# Patient Record
Sex: Male | Born: 1965
Health system: Southern US, Community
[De-identification: ages and names within clinical notes are randomized; demographics above are authoritative.]

## PROBLEM LIST (undated history)

## (undated) DIAGNOSIS — I10 Essential (primary) hypertension: Secondary | ICD-10-CM

## (undated) DIAGNOSIS — J189 Pneumonia, unspecified organism: Secondary | ICD-10-CM

## (undated) HISTORY — PX: CIRCUMCISION: SUR203

---

## 2011-11-04 ENCOUNTER — Other Ambulatory Visit: Payer: Self-pay | Admitting: Neurology

## 2011-11-04 DIAGNOSIS — R531 Weakness: Secondary | ICD-10-CM

## 2011-11-07 ENCOUNTER — Ambulatory Visit (HOSPITAL_COMMUNITY)
Admission: RE | Admit: 2011-11-07 | Discharge: 2011-11-07 | Disposition: A | Discharge status: Home / Self Care | Payer: BC Managed Care – PPO | Source: Ambulatory Visit | Attending: Neurology | Admitting: Neurology

## 2011-11-07 DIAGNOSIS — M5020 Other cervical disc displacement, unspecified cervical region: Secondary | ICD-10-CM | POA: Insufficient documentation

## 2011-11-07 DIAGNOSIS — R531 Weakness: Secondary | ICD-10-CM

## 2011-11-07 DIAGNOSIS — M6281 Muscle weakness (generalized): Secondary | ICD-10-CM | POA: Insufficient documentation

## 2011-11-07 DIAGNOSIS — M542 Cervicalgia: Secondary | ICD-10-CM | POA: Insufficient documentation

## 2011-11-11 ENCOUNTER — Encounter (HOSPITAL_COMMUNITY): Payer: Self-pay | Admitting: Respiratory Therapy

## 2011-11-11 ENCOUNTER — Other Ambulatory Visit: Payer: Self-pay | Admitting: Neurosurgery

## 2011-11-14 ENCOUNTER — Encounter (HOSPITAL_COMMUNITY)
Admission: RE | Admit: 2011-11-14 | Discharge: 2011-11-14 | Disposition: A | Discharge status: Home / Self Care | Payer: BC Managed Care – PPO | Source: Ambulatory Visit | Attending: Neurosurgery | Admitting: Neurosurgery

## 2011-11-14 ENCOUNTER — Encounter (HOSPITAL_COMMUNITY): Payer: Self-pay

## 2011-11-14 HISTORY — DX: Essential (primary) hypertension: I10

## 2011-11-14 HISTORY — DX: Pneumonia, unspecified organism: J18.9

## 2011-11-14 LAB — CBC
Hemoglobin: 15.1 g/dL (ref 13.0–17.0)
MCH: 29.6 pg (ref 26.0–34.0)
MCHC: 33.4 g/dL (ref 30.0–36.0)
MCV: 88.6 fL (ref 78.0–100.0)
RBC: 5.1 MIL/uL (ref 4.22–5.81)

## 2011-11-14 LAB — BASIC METABOLIC PANEL
BUN: 15 mg/dL (ref 6–23)
CO2: 28 mEq/L (ref 19–32)
Calcium: 10 mg/dL (ref 8.4–10.5)
GFR calc non Af Amer: >90 mL/min (ref >90)
Glucose, Bld: 97 mg/dL (ref 70–99)
Potassium: 3.9 mEq/L (ref 3.5–5.1)

## 2011-11-14 NOTE — Pre-Procedure Instructions (Signed)
20 David Morse  11/14/2011   Your procedure is scheduled on:  Nov 17, 2011 (Monday)  Report to [facility] at 1245 AM.  Call this number if you have problems the morning of surgery: [phone]   Remember:   Do not eat food:After Midnight.  May have clear liquids: up to 4 Hours before arrival.  Clear liquids include soda, tea, black coffee, apple or grape juice, broth.  Take these medicines the morning of surgery with A SIP OF WATER: norvasc, doxycycline   Do not wear jewelry, make-up or nail polish.  Do not wear lotions, powders, or perfumes. You may wear deodorant.  Do not shave 48 hours prior to surgery.  Do not bring valuables to the hospital.  Contacts, dentures or bridgework may not be worn into surgery.  Leave suitcase in the car. After surgery it may be brought to your room.  For patients admitted to the hospital, checkout time is 11:00 AM the day of discharge.   Patients discharged the day of surgery will not be allowed to drive home.  Name and phone number of your driver: na  Special Instructions: CHG Shower Use Special Wash: 1/2 bottle night before surgery and 1/2 bottle morning of surgery.   Please read over the following fact sheets that you were given: Pain Booklet and Surgical Site Infection Prevention

## 2011-11-16 MED ORDER — CEFAZOLIN SODIUM-DEXTROSE 2-3 GM-% IV SOLR
2.0000 g | INTRAVENOUS | Status: AC
  Administered 2011-11-17: 2 g via INTRAVENOUS
  Filled 2011-11-16: qty 50

## 2011-11-17 ENCOUNTER — Ambulatory Visit (HOSPITAL_COMMUNITY): Payer: BC Managed Care – PPO | Admitting: Critical Care Medicine

## 2011-11-17 ENCOUNTER — Encounter (HOSPITAL_COMMUNITY): Payer: Self-pay | Admitting: *Deleted

## 2011-11-17 ENCOUNTER — Inpatient Hospital Stay (HOSPITAL_COMMUNITY)
Admission: RE | Admit: 2011-11-17 | Discharge: 2011-11-21 | DRG: 558 | Disposition: A | Discharge status: Home / Self Care | Payer: BC Managed Care – PPO | Source: Ambulatory Visit | Attending: Neurosurgery | Admitting: Neurosurgery

## 2011-11-17 ENCOUNTER — Encounter (HOSPITAL_COMMUNITY): Payer: Self-pay | Admitting: Critical Care Medicine

## 2011-11-17 ENCOUNTER — Encounter (HOSPITAL_COMMUNITY)
Admission: RE | Disposition: A | Discharge status: Home / Self Care | Payer: Self-pay | Source: Ambulatory Visit | Attending: Neurosurgery

## 2011-11-17 ENCOUNTER — Ambulatory Visit (HOSPITAL_COMMUNITY): Payer: BC Managed Care – PPO

## 2011-11-17 DIAGNOSIS — I10 Essential (primary) hypertension: Secondary | ICD-10-CM | POA: Diagnosis present

## 2011-11-17 DIAGNOSIS — Z01812 Encounter for preprocedural laboratory examination: Secondary | ICD-10-CM

## 2011-11-17 DIAGNOSIS — Y9210 Unspecified residential institution as the place of occurrence of the external cause: Secondary | ICD-10-CM | POA: Diagnosis not present

## 2011-11-17 DIAGNOSIS — R339 Retention of urine, unspecified: Secondary | ICD-10-CM | POA: Diagnosis not present

## 2011-11-17 DIAGNOSIS — Y838 Other surgical procedures as the cause of abnormal reaction of the patient, or of later complication, without mention of misadventure at the time of the procedure: Secondary | ICD-10-CM | POA: Diagnosis not present

## 2011-11-17 DIAGNOSIS — F17200 Nicotine dependence, unspecified, uncomplicated: Secondary | ICD-10-CM | POA: Diagnosis present

## 2011-11-17 DIAGNOSIS — IMO0002 Reserved for concepts with insufficient information to code with codable children: Secondary | ICD-10-CM | POA: Diagnosis not present

## 2011-11-17 DIAGNOSIS — Z888 Allergy status to other drugs, medicaments and biological substances status: Secondary | ICD-10-CM

## 2011-11-17 DIAGNOSIS — M4712 Other spondylosis with myelopathy, cervical region: Principal | ICD-10-CM | POA: Diagnosis present

## 2011-11-17 HISTORY — PX: ANTERIOR CERVICAL DECOMP/DISCECTOMY FUSION: SHX1161

## 2011-11-17 SURGERY — ANTERIOR CERVICAL DECOMPRESSION/DISCECTOMY FUSION 1 LEVEL
Anesthesia: General | Site: Neck | Wound class: Clean

## 2011-11-17 MED ORDER — HYDROCODONE-ACETAMINOPHEN 5-325 MG PO TABS: 1.0000 | ORAL_TABLET | ORAL | Status: DC | PRN

## 2011-11-17 MED ORDER — ONDANSETRON HCL 4 MG/2ML IJ SOLN: 4.0000 mg | Freq: Four times a day (QID) | INTRAMUSCULAR | Status: DC | PRN

## 2011-11-17 MED ORDER — FENTANYL CITRATE 0.05 MG/ML IJ SOLN
INTRAMUSCULAR | Status: DC | PRN
  Administered 2011-11-17 (×2): 50 ug via INTRAVENOUS
  Administered 2011-11-17: 100 ug via INTRAVENOUS
  Administered 2011-11-17 (×3): 50 ug via INTRAVENOUS
  Administered 2011-11-17: 100 ug via INTRAVENOUS

## 2011-11-17 MED ORDER — PHENOL 1.4 % MT LIQD
1.0000 | OROMUCOSAL | Status: DC | PRN
  Filled 2011-11-17 (×2): qty 177

## 2011-11-17 MED ORDER — NEOSTIGMINE METHYLSULFATE 1 MG/ML IJ SOLN
INTRAMUSCULAR | Status: DC | PRN
  Administered 2011-11-17: 5 mg via INTRAVENOUS

## 2011-11-17 MED ORDER — PANTOPRAZOLE SODIUM 40 MG IV SOLR
40.0000 mg | Freq: Every day | INTRAVENOUS | Status: DC
  Administered 2011-11-17: 40 mg via INTRAVENOUS
  Filled 2011-11-17 (×3): qty 40

## 2011-11-17 MED ORDER — DIAZEPAM 5 MG PO TABS
5.0000 mg | ORAL_TABLET | Freq: Four times a day (QID) | ORAL | Status: DC | PRN
  Administered 2011-11-18 – 2011-11-20 (×7): 5 mg via ORAL
  Filled 2011-11-17 (×7): qty 1

## 2011-11-17 MED ORDER — OXYCODONE-ACETAMINOPHEN 5-325 MG PO TABS
1.0000 | ORAL_TABLET | ORAL | Status: DC | PRN
  Administered 2011-11-17 – 2011-11-21 (×10): 2 via ORAL
  Filled 2011-11-17 (×10): qty 2

## 2011-11-17 MED ORDER — MIDAZOLAM HCL 5 MG/5ML IJ SOLN
INTRAMUSCULAR | Status: DC | PRN
  Administered 2011-11-17: 2 mg via INTRAVENOUS

## 2011-11-17 MED ORDER — DOCUSATE SODIUM 100 MG PO CAPS
100.0000 mg | ORAL_CAPSULE | Freq: Two times a day (BID) | ORAL | Status: DC
  Administered 2011-11-17 – 2011-11-21 (×7): 100 mg via ORAL
  Filled 2011-11-17 (×9): qty 1

## 2011-11-17 MED ORDER — PHENYLEPHRINE HCL 10 MG/ML IJ SOLN
INTRAMUSCULAR | Status: DC | PRN
  Administered 2011-11-17: 80 ug via INTRAVENOUS
  Administered 2011-11-17: 40 ug via INTRAVENOUS

## 2011-11-17 MED ORDER — BACITRACIN 50000 UNITS IM SOLR
INTRAMUSCULAR | Status: AC
  Filled 2011-11-17: qty 1

## 2011-11-17 MED ORDER — 0.9 % SODIUM CHLORIDE (POUR BTL) OPTIME
TOPICAL | Status: DC | PRN
  Administered 2011-11-17: 1000 mL

## 2011-11-17 MED ORDER — HEMOSTATIC AGENTS (NO CHARGE) OPTIME
TOPICAL | Status: DC | PRN
  Administered 2011-11-17: 1 via TOPICAL

## 2011-11-17 MED ORDER — ONDANSETRON HCL 4 MG/2ML IJ SOLN
INTRAMUSCULAR | Status: DC | PRN
  Administered 2011-11-17: 4 mg via INTRAVENOUS

## 2011-11-17 MED ORDER — AMLODIPINE BESYLATE 5 MG PO TABS
5.0000 mg | ORAL_TABLET | Freq: Every day | ORAL | Status: DC
  Administered 2011-11-18 – 2011-11-21 (×3): 5 mg via ORAL
  Filled 2011-11-17 (×4): qty 1

## 2011-11-17 MED ORDER — DEXAMETHASONE 4 MG PO TABS
4.0000 mg | ORAL_TABLET | Freq: Four times a day (QID) | ORAL | Status: AC
  Administered 2011-11-18 (×2): 4 mg via ORAL
  Filled 2011-11-17 (×2): qty 1

## 2011-11-17 MED ORDER — MORPHINE SULFATE 4 MG/ML IJ SOLN
1.0000 mg | INTRAMUSCULAR | Status: DC | PRN
  Administered 2011-11-17: 2 mg via INTRAVENOUS
  Administered 2011-11-18 – 2011-11-19 (×4): 4 mg via INTRAVENOUS
  Filled 2011-11-17 (×5): qty 1

## 2011-11-17 MED ORDER — ZOLPIDEM TARTRATE 5 MG PO TABS
10.0000 mg | ORAL_TABLET | Freq: Every evening | ORAL | Status: DC | PRN
  Administered 2011-11-18 – 2011-11-20 (×3): 10 mg via ORAL
  Filled 2011-11-17 (×4): qty 1

## 2011-11-17 MED ORDER — LISINOPRIL-HYDROCHLOROTHIAZIDE 20-12.5 MG PO TABS: 1.0000 | ORAL_TABLET | Freq: Every day | ORAL | Status: DC

## 2011-11-17 MED ORDER — ROCURONIUM BROMIDE 100 MG/10ML IV SOLN
INTRAVENOUS | Status: DC | PRN
  Administered 2011-11-17: 50 mg via INTRAVENOUS

## 2011-11-17 MED ORDER — LACTATED RINGERS IV SOLN
INTRAVENOUS | Status: DC | PRN
  Administered 2011-11-17 (×2): via INTRAVENOUS

## 2011-11-17 MED ORDER — GLYCOPYRROLATE 0.2 MG/ML IJ SOLN
INTRAMUSCULAR | Status: DC | PRN
  Administered 2011-11-17: .8 mg via INTRAVENOUS

## 2011-11-17 MED ORDER — THROMBIN 5000 UNITS EX KIT
PACK | CUTANEOUS | Status: DC | PRN
  Administered 2011-11-17 (×2): 5000 [IU] via TOPICAL

## 2011-11-17 MED ORDER — LACTATED RINGERS IV SOLN
INTRAVENOUS | Status: DC
  Administered 2011-11-17: 23:00:00 via INTRAVENOUS

## 2011-11-17 MED ORDER — ACETAMINOPHEN 325 MG PO TABS
650.0000 mg | ORAL_TABLET | ORAL | Status: DC | PRN
  Filled 2011-11-17: qty 2

## 2011-11-17 MED ORDER — PROPOFOL 10 MG/ML IV EMUL
INTRAVENOUS | Status: DC | PRN
  Administered 2011-11-17: 200 mg via INTRAVENOUS

## 2011-11-17 MED ORDER — BACITRACIN ZINC 500 UNIT/GM EX OINT
TOPICAL_OINTMENT | CUTANEOUS | Status: DC | PRN
  Administered 2011-11-17: 1 via TOPICAL

## 2011-11-17 MED ORDER — HYDROCHLOROTHIAZIDE 12.5 MG PO CAPS
12.5000 mg | ORAL_CAPSULE | Freq: Every day | ORAL | Status: DC
  Administered 2011-11-17 – 2011-11-21 (×4): 12.5 mg via ORAL
  Filled 2011-11-17 (×5): qty 1

## 2011-11-17 MED ORDER — VECURONIUM BROMIDE 10 MG IV SOLR
INTRAVENOUS | Status: DC | PRN
  Administered 2011-11-17: 2 mg via INTRAVENOUS
  Administered 2011-11-17: 1 mg via INTRAVENOUS

## 2011-11-17 MED ORDER — DEXAMETHASONE SODIUM PHOSPHATE 4 MG/ML IJ SOLN
INTRAMUSCULAR | Status: DC | PRN
  Administered 2011-11-17: 8 mg via INTRAVENOUS

## 2011-11-17 MED ORDER — CEFAZOLIN SODIUM-DEXTROSE 2-3 GM-% IV SOLR
2.0000 g | Freq: Three times a day (TID) | INTRAVENOUS | Status: AC
  Administered 2011-11-18 (×2): 2 g via INTRAVENOUS
  Filled 2011-11-17 (×2): qty 50

## 2011-11-17 MED ORDER — LIDOCAINE HCL 4 % MT SOLN
OROMUCOSAL | Status: DC | PRN
  Administered 2011-11-17: 4 mL via TOPICAL

## 2011-11-17 MED ORDER — ONDANSETRON HCL 4 MG/2ML IJ SOLN: 4.0000 mg | INTRAMUSCULAR | Status: DC | PRN

## 2011-11-17 MED ORDER — DEXAMETHASONE SODIUM PHOSPHATE 4 MG/ML IJ SOLN
4.0000 mg | Freq: Four times a day (QID) | INTRAMUSCULAR | Status: AC
  Filled 2011-11-17 (×2): qty 1

## 2011-11-17 MED ORDER — SODIUM CHLORIDE 0.9 % IR SOLN
Status: DC | PRN
  Administered 2011-11-17: 17:00:00

## 2011-11-17 MED ORDER — MORPHINE SULFATE 4 MG/ML IJ SOLN
INTRAMUSCULAR | Status: AC
  Administered 2011-11-19: 4 mg via INTRAVENOUS
  Filled 2011-11-17: qty 1

## 2011-11-17 MED ORDER — ACETAMINOPHEN 650 MG RE SUPP: 650.0000 mg | RECTAL | Status: DC | PRN

## 2011-11-17 MED ORDER — DOXYCYCLINE HYCLATE 100 MG PO TABS
100.0000 mg | ORAL_TABLET | Freq: Two times a day (BID) | ORAL | Status: DC
  Administered 2011-11-17 – 2011-11-21 (×7): 100 mg via ORAL
  Filled 2011-11-17 (×10): qty 1

## 2011-11-17 MED ORDER — MENTHOL 3 MG MT LOZG
1.0000 | LOZENGE | OROMUCOSAL | Status: DC | PRN
  Administered 2011-11-18 – 2011-11-19 (×3): 3 mg via ORAL
  Filled 2011-11-17 (×5): qty 9

## 2011-11-17 MED ORDER — BUPIVACAINE-EPINEPHRINE 0.5% -1:200000 IJ SOLN
INTRAMUSCULAR | Status: DC | PRN
  Administered 2011-11-17: 10 mL

## 2011-11-17 MED ORDER — SODIUM CHLORIDE 0.9 % IV SOLN
INTRAVENOUS | Status: AC
  Filled 2011-11-17: qty 500

## 2011-11-17 MED ORDER — LISINOPRIL 20 MG PO TABS
20.0000 mg | ORAL_TABLET | Freq: Every day | ORAL | Status: DC
  Administered 2011-11-17 – 2011-11-21 (×4): 20 mg via ORAL
  Filled 2011-11-17 (×5): qty 1

## 2011-11-17 MED ORDER — HYDROMORPHONE HCL PF 1 MG/ML IJ SOLN: 0.2500 mg | INTRAMUSCULAR | Status: DC | PRN

## 2011-11-17 SURGICAL SUPPLY — 64 items
BAG DECANTER FOR FLEXI CONT (MISCELLANEOUS) ×3 IMPLANT
BENZOIN TINCTURE PRP APPL 2/3 (GAUZE/BANDAGES/DRESSINGS) ×3 IMPLANT
BIT DRILL SPINE QC 14 (BIT) ×3 IMPLANT
BLADE SURG 15 STRL LF DISP TIS (BLADE) IMPLANT
BLADE SURG 15 STRL SS (BLADE)
BLADE ULTRA TIP 2M (BLADE) ×3 IMPLANT
BRUSH SCRUB EZ PLAIN DRY (MISCELLANEOUS) ×3 IMPLANT
BUR BARREL STRAIGHT FLUTE 4.0 (BURR) ×3 IMPLANT
BUR MATCHSTICK NEURO 3.0 LAGG (BURR) ×3 IMPLANT
CANISTER SUCTION 2500CC (MISCELLANEOUS) ×3 IMPLANT
CLOTH BEACON ORANGE TIMEOUT ST (SAFETY) ×3 IMPLANT
CONT SPEC 4OZ CLIKSEAL STRL BL (MISCELLANEOUS) ×3 IMPLANT
COVER MAYO STAND STRL (DRAPES) ×3 IMPLANT
DEVICE FUSION VISTA 14X14X9MM (Spacer) ×2 IMPLANT
DRAPE LAPAROTOMY 100X72 PEDS (DRAPES) ×3 IMPLANT
DRAPE MICROSCOPE LEICA (MISCELLANEOUS) IMPLANT
DRAPE POUCH INSTRU U-SHP 10X18 (DRAPES) ×3 IMPLANT
DRAPE SURG 17X23 STRL (DRAPES) ×6 IMPLANT
ELECT REM PT RETURN 9FT ADLT (ELECTROSURGICAL) ×3
ELECTRODE REM PT RTRN 9FT ADLT (ELECTROSURGICAL) ×2 IMPLANT
GAUZE SPONGE 4X4 16PLY XRAY LF (GAUZE/BANDAGES/DRESSINGS) IMPLANT
GLOVE BIO SURGEON STRL SZ 6.5 (GLOVE) ×6 IMPLANT
GLOVE BIO SURGEON STRL SZ8.5 (GLOVE) ×3 IMPLANT
GLOVE BIOGEL PI IND STRL 6.5 (GLOVE) ×2 IMPLANT
GLOVE BIOGEL PI IND STRL 7.5 (GLOVE) ×2 IMPLANT
GLOVE BIOGEL PI IND STRL 8.5 (GLOVE) ×2 IMPLANT
GLOVE BIOGEL PI INDICATOR 6.5 (GLOVE) ×1
GLOVE BIOGEL PI INDICATOR 7.5 (GLOVE) ×1
GLOVE BIOGEL PI INDICATOR 8.5 (GLOVE) ×1
GLOVE ECLIPSE 7.5 STRL STRAW (GLOVE) ×3 IMPLANT
GLOVE ECLIPSE 8.5 STRL (GLOVE) ×3 IMPLANT
GLOVE EXAM NITRILE LRG STRL (GLOVE) IMPLANT
GLOVE EXAM NITRILE MD LF STRL (GLOVE) ×3 IMPLANT
GLOVE EXAM NITRILE XL STR (GLOVE) IMPLANT
GLOVE EXAM NITRILE XS STR PU (GLOVE) IMPLANT
GLOVE SS BIOGEL STRL SZ 8 (GLOVE) ×2 IMPLANT
GLOVE SUPERSENSE BIOGEL SZ 8 (GLOVE) ×1
GOWN BRE IMP SLV AUR LG STRL (GOWN DISPOSABLE) ×3 IMPLANT
GOWN BRE IMP SLV AUR XL STRL (GOWN DISPOSABLE) ×3 IMPLANT
KIT BASIN OR (CUSTOM PROCEDURE TRAY) ×3 IMPLANT
KIT ROOM TURNOVER OR (KITS) ×3 IMPLANT
MARKER SKIN DUAL TIP RULER LAB (MISCELLANEOUS) ×3 IMPLANT
NEEDLE HYPO 22GX1.5 SAFETY (NEEDLE) ×3 IMPLANT
NEEDLE SPNL 18GX3.5 QUINCKE PK (NEEDLE) ×3 IMPLANT
NS IRRIG 1000ML POUR BTL (IV SOLUTION) ×3 IMPLANT
PACK LAMINECTOMY NEURO (CUSTOM PROCEDURE TRAY) ×3 IMPLANT
PIN DISTRACTION 14MM (PIN) ×6 IMPLANT
PLATE ANT CERV XTEND 1 LV 14 (Plate) ×3 IMPLANT
PUTTY ABX ACTIFUSE 1.5ML (Putty) ×3 IMPLANT
RUBBERBAND STERILE (MISCELLANEOUS) IMPLANT
SCREW XTD VAR 4.2 SELF TAP (Screw) ×12 IMPLANT
SPONGE GAUZE 4X4 12PLY (GAUZE/BANDAGES/DRESSINGS) ×3 IMPLANT
SPONGE INTESTINAL PEANUT (DISPOSABLE) ×3 IMPLANT
SPONGE SURGIFOAM ABS GEL SZ50 (HEMOSTASIS) ×3 IMPLANT
STRIP CLOSURE SKIN 1/2X4 (GAUZE/BANDAGES/DRESSINGS) ×3 IMPLANT
SUT VIC AB 0 CT1 27 (SUTURE) ×1
SUT VIC AB 0 CT1 27XBRD ANTBC (SUTURE) ×2 IMPLANT
SUT VIC AB 3-0 SH 8-18 (SUTURE) ×3 IMPLANT
SYR 20ML ECCENTRIC (SYRINGE) ×3 IMPLANT
TAPE CLOTH SURG 4X10 WHT LF (GAUZE/BANDAGES/DRESSINGS) ×3 IMPLANT
TOWEL OR 17X24 6PK STRL BLUE (TOWEL DISPOSABLE) ×3 IMPLANT
TOWEL OR 17X26 10 PK STRL BLUE (TOWEL DISPOSABLE) ×3 IMPLANT
VISTA 14X14X9MM (Spacer) ×3 IMPLANT
WATER STERILE IRR 1000ML POUR (IV SOLUTION) ×3 IMPLANT

## 2011-11-17 NOTE — Anesthesia Procedure Notes (Signed)
Procedure Name: Intubation Date/Time: 11/17/2011 4:41 PM Performed by: Elon Alas Pre-anesthesia Checklist: Emergency Drugs available, Timeout performed, Suction available, Patient being monitored and Patient identified Patient Re-evaluated:Patient Re-evaluated prior to inductionOxygen Delivery Method: Circle System Utilized Preoxygenation: Pre-oxygenation with 100% oxygen Intubation Type: IV induction Ventilation: Mask ventilation without difficulty, Oral airway inserted - appropriate to patient size and Two handed mask ventilation required Tube size: 7.5 mm Number of attempts: 1 Airway Equipment and Method: video-laryngoscopy Placement Confirmation: ETT inserted through vocal cords under direct vision,  positive ETCO2 and breath sounds checked- equal and bilateral Secured at: 23 cm Tube secured with: Tape Dental Injury: Teeth and Oropharynx as per pre-operative assessment

## 2011-11-17 NOTE — Anesthesia Postprocedure Evaluation (Signed)
  Anesthesia Post-op Note  Patient: David Morse  Procedure(s) Performed: Procedure(s) (LRB): ANTERIOR CERVICAL DECOMPRESSION/DISCECTOMY FUSION 1 LEVEL (N/A)  Patient Location: PACU and PICU  Anesthesia Type: General  Level of Consciousness: awake, oriented, sedated and patient cooperative  Airway and Oxygen Therapy: Patient Spontanous Breathing and Patient connected to nasal cannula oxygen  Post-op Pain: mild  Post-op Assessment: Post-op Vital signs reviewed, Patient's Cardiovascular Status Stable, Respiratory Function Stable, Patent Airway, No signs of Nausea or vomiting and Pain level controlled  Post-op Vital Signs: stable  Complications: No apparent anesthesia complications

## 2011-11-17 NOTE — Preoperative (Signed)
Beta Blockers   Reason not to administer Beta Blockers:Not Applicable 

## 2011-11-17 NOTE — Anesthesia Preprocedure Evaluation (Signed)
Anesthesia Evaluation  Patient identified by MRN, date of birth, ID band Patient awake    Reviewed: Allergy & Precautions, H&P , NPO status , Patient's Chart, lab work & pertinent test results  Airway Mallampati: II  Neck ROM: full    Dental   Pulmonary Current Smoker,          Cardiovascular hypertension,     Neuro/Psych    GI/Hepatic   Endo/Other    Renal/GU      Musculoskeletal   Abdominal   Peds  Hematology   Anesthesia Other Findings   Reproductive/Obstetrics                           Anesthesia Physical Anesthesia Plan  ASA: II  Anesthesia Plan: General   Post-op Pain Management:    Induction: Intravenous  Airway Management Planned: Oral ETT  Additional Equipment:   Intra-op Plan:   Post-operative Plan: Extubation in OR  Informed Consent: I have reviewed the patients History and Physical, chart, labs and discussed the procedure including the risks, benefits and alternatives for the proposed anesthesia with the patient or authorized representative who has indicated his/her understanding and acceptance.     Plan Discussed with: CRNA and Surgeon  Anesthesia Plan Comments:         Anesthesia Quick Evaluation  

## 2011-11-17 NOTE — Op Note (Signed)
Brief history: The patient is a 46 year old black male who is complaining of difficulty with ambulation and complaints consistent with a cervical myelopathy. He failed medical management worked up with cervical MRI which demonstrated severe spinal stenosis at C3-4. I discussed the various treatment options with the patient including surgery. The patient has weighed the risks, benefits, and alternatives surgery and decided proceed with a C3-4 anterior cervicectomy, fusion and plating.  Preoperative diagnosis: C3-4 disc degeneration, spondylosis, stenosis, cervical myelopathy/radiculopathy, cervicalgia  Postoperative diagnosis: The same  Procedure: C3-4 Anterior cervical discectomy/decompression; E3 4 interbody arthrodesis with local morcellized autograft bone and Actifuse bone graft extender; insertion of interbody prosthesis at C3-4 (Zimmer peek interbody prosthesis); anterior cervical plating from C3-C4 with globus titanium plate  Surgeon: Dr. Delma Officer  Asst.: Dr. Barnett Abu  Anesthesia: Gen. endotracheal  Estimated blood loss: 100 cc  Drains: None  Complications: None  Description of procedure: The patient was brought to the operating room by the anesthesia team. General endotracheal anesthesia was induced. A roll was placed under the patient's shoulders to keep the neck in the neutral position. The patient's anterior cervical region was then prepared with Betadine scrub and Betadine solution. Sterile drapes were applied.  The area to be incised was then injected with Marcaine with epinephrine solution. I then used a scalpel to make a transverse incision in the patient's left anterior neck. I used the Metzenbaum scissors to divide the platysmal muscle and then to dissect medial to the sternocleidomastoid muscle, jugular vein, and carotid artery. I carefully dissected down towards the anterior cervical spine identifying the esophagus and retracting it medially. Then using Kitner swabs to  clear soft tissue from the anterior cervical spine. We then inserted a bent spinal needle into the upper exposed intervertebral disc space. We then obtained intraoperative radiographs confirm our location.  I then used electrocautery to detach the medial border of the longus colli muscle bilaterally from the C3-4 intervertebral disc spaces. I then inserted the Caspar self-retaining retractor underneath the longus colli muscle bilaterally to provide exposure.  We then incised the intervertebral disc at C3-4. We then performed a partial intervertebral discectomy with a pituitary forceps and the Karlin curettes. I then inserted distraction screws into the vertebral bodies at C3 and C4. We then distracted the interspace. We then used the high-speed drill to decorticate the vertebral endplates at C3-4, to drill away the remainder of the intervertebral disc, to drill away some posterior spondylosis, and to thin out the posterior longitudinal ligament. I then incised ligament with the arachnoid knife. We then removed the ligament with a Kerrison punches undercutting the vertebral endplates and decompressing the thecal sac. We then performed foraminotomies about the bilateral C4 nerve roots. This completed the decompression at this level.   We now turned our to attention to the interbody fusion. We used the trial spacers to determine the appropriate size for the interbody prosthesis. We then pre-filled prosthesis with a combination of local morcellized autograft bone that we obtained during decompression as well as Actifuse bone graft extender. We then inserted the prosthesis into the distracted interspace at C3-4. We then removed the distraction screws. There was a good snug fit of the prosthesis in the interspace.   Having completed the fusion we now turned attention to the anterior spinal instrumentation. We used the high-speed drill to drill away some anterior spondylosis at the disc spaces so that the plate  lay down flat. We selected the appropriate length titanium anterior cervical plate. We laid  it along the anterior aspect of the vertebral bodies from C3-C4. We then drilled 14 mm holes at C3 and C4. We then secured the plate to the vertebral bodies by placing two 14 mm self-tapping screws at C3 and C4. We then obtained intraoperative radiograph. The demonstrating good position of the instrumentation. We therefore secured the screws the plate the locking each cam. This completed the instrumentation.  We then obtained hemostasis using bipolar electrocautery. We irrigated the wound out with bacitracin solution. We then removed the retractor. We inspected the esophagus for any damage. There was none apparent. We then reapproximated patient's platysmal muscle with interrupted 3-0 Vicryl suture. We then reapproximated the subcutaneous tissue with interrupted 3-0 Vicryl suture. The skin was reapproximated with Steri-Strips and benzoin. The wound was then covered with bacitracin ointment. A sterile dressing was applied. The drapes were removed. Patient was subsequently extubated by the anesthesia team and transported to the post anesthesia care unit in stable condition. All sponge instrument and needle counts were correct at the end of this case.

## 2011-11-17 NOTE — H&P (Signed)
Subjective: 46 year old black male who began having trouble with his ambulation several months ago. He was worked up with a cervical MRI which demonstrated severe stenosis at C3-4. I discussed the various treatment options with the patient including surgery. The patient has weighed the risks, benefits and alternatives to surgery and he decided to proceed with a C3-4 anterior cervical discectomy fusion and plating.   Past Medical History  Diagnosis Date  . Pneumonia   . Hypertension     Past Surgical History  Procedure Date  . Circumcision     [facility] in [place]    Allergies  Allergen Reactions  . Morphine And Related Nausea And Vomiting    History  Substance Use Topics  . Smoking status: Current Everyday Smoker -- 1.0 packs/day  . Smokeless tobacco: Not on file  . Alcohol Use: No    History reviewed. No pertinent family history. Prior to Admission medications   Medication Sig Start Date End Date Taking? Authorizing Provider  amLODipine (NORVASC) 5 MG tablet Take 5 mg by mouth daily.   Yes Historical Provider, MD  doxycycline (VIBRA-TABS) 100 MG tablet Take 100 mg by mouth 2 (two) times daily.   Yes Historical Provider, MD  lisinopril-hydrochlorothiazide (PRINZIDE,ZESTORETIC) 20-12.5 MG per tablet Take 1 tablet by mouth daily.   Yes Historical Provider, MD     Review of Systems  Positive ROS: As above  All other systems have been reviewed and were otherwise negative with the exception of those mentioned in the HPI and as above.  Objective: Vital signs in last 24 hours: Temp:  [97.7 F (36.5 C)] 97.7 F (36.5 C) (02/11 1309) Pulse Rate:  [89] 89  (02/11 1309) Resp:  [18] 18  (02/11 1309) BP: (146)/(90) 146/90 mmHg (02/11 1309) SpO2:  [96 %] 96 % (02/11 1309)  General Appearance: Alert, cooperative, no distress, appears stated age Head: Normocephalic, without obvious abnormality, atraumatic Eyes: PERRL, conjunctiva/corneas clear, EOM's intact, fundi benign, both eyes       Ears: Normal TM's and external ear canals, both ears Throat: Lips, mucosa, and tongue normal; teeth and gums normal Neck: Supple, symmetrical, trachea midline, no adenopathy; thyroid: No enlargement/tenderness/nodules; no carotid bruit or JVD Back: Symmetric, no curvature, ROM normal, no CVA tenderness Lungs: Clear to auscultation bilaterally, respirations unlabored Heart: Regular rate and rhythm, S1 and S2 normal, no murmur, rub or gallop Abdomen: Soft, non-tender, bowel sounds active all four quadrants, no masses, no organomegaly Extremities: Extremities normal, atraumatic, no cyanosis or edema Pulses: 2+ and symmetric all extremities Skin: Skin color, texture, turgor normal, no rashes or lesions  NEUROLOGIC:   Mental status: alert and oriented, no aphasia, good attention span, Fund of knowledge/ memory ok Motor Exam - the patient's motor strength is 4+ to 5 over 5 in his bilateral deltoids biceps triceps wrist extensor so as quadriceps gastrocnemius and dorsiflexors. The patient has weakness in his bilateral hand grip at 4-4 minus over 5. Sensory Exam - the patient has numbness in his bilateral upper extremities. Reflexes: Deep tendon reflexes are 3-4/4 his bilateral biceps triceps quadriceps and gastrocnemius. He has sustained ankle clonus bilaterally Coordination - the patient walks with a myelopathic gait. Gait - as above Balance - as above Cranial Nerves: I: smell Not tested  II: visual acuity  OS: Normal    OD: Normal   II: visual fields Full to confrontation  II: pupils Equal, round, reactive to light  III,VII: ptosis None  III,IV,VI: extraocular muscles  Full ROM  V: mastication Normal  V: facial light touch sensation  Normal  V,VII: corneal reflex  Present  VII: facial muscle function - upper  Normal  VII: facial muscle function - lower Normal  VIII: hearing Not tested  IX: soft palate elevation  Normal  IX,X: gag reflex Present  XI: trapezius strength  5/5  XI:  sternocleidomastoid strength 5/5  XI: neck flexion strength  5/5  XII: tongue strength  Normal    Data Review Lab Results  Component Value Date   WBC 12.6* 11/14/2011   HGB 15.1 11/14/2011   HCT 45.2 11/14/2011   MCV 88.6 11/14/2011   PLT 286 11/14/2011   Lab Results  Component Value Date   NA 140 11/14/2011   K 3.9 11/14/2011   CL 103 11/14/2011   CO2 28 11/14/2011   BUN 15 11/14/2011   CREATININE 0.83 11/14/2011   GLUCOSE 97 11/14/2011   No results found for this basename: INR, PROTIME    Assessment/Plan: C3-4 spondylosis, stenosis, cervical myelopathy/cervicalgia: I discussed situation with the patient. I reviewed his MR scan and x-rays with them and pointed out the abnormalities. We have discussed the various treatment options including doing surgery. I recommend he undergo a C3-4 anterior cervical discectomy fusion plating to decompress the spinal cord. I described the surgery to him. I've shown him surgical models. We discussed the risks, benefits, alternatives and likelihood of achieving our goals with surgery. I answered all his and his brothers questions. They want to proceed with the operation.   Desira Alessandrini D 11/17/2011 4:08 PM

## 2011-11-17 NOTE — Transfer of Care (Signed)
Immediate Anesthesia Transfer of Care Note  Patient: David Morse  Procedure(s) Performed: Procedure(s) (LRB): ANTERIOR CERVICAL DECOMPRESSION/DISCECTOMY FUSION 1 LEVEL (N/A)  Patient Location: PACU  Anesthesia Type: General  Level of Consciousness: lethargic and responds to stimulation  Airway & Oxygen Therapy: Patient Spontanous Breathing and non-rebreather face mask  Post-op Assessment: Report given to PACU RN  Post vital signs: Reviewed and stable  Complications: No apparent anesthesia complications

## 2011-11-18 ENCOUNTER — Encounter (HOSPITAL_COMMUNITY): Payer: Self-pay | Admitting: Neurosurgery

## 2011-11-18 MED ORDER — PANTOPRAZOLE SODIUM 40 MG PO TBEC
40.0000 mg | DELAYED_RELEASE_TABLET | Freq: Every day | ORAL | Status: DC
  Administered 2011-11-18 – 2011-11-20 (×3): 40 mg via ORAL
  Filled 2011-11-18 (×5): qty 1

## 2011-11-18 MED ORDER — LABETALOL HCL 5 MG/ML IV SOLN
INTRAVENOUS | Status: AC
  Filled 2011-11-18: qty 4

## 2011-11-18 MED ORDER — HYDRALAZINE HCL 20 MG/ML IJ SOLN
10.0000 mg | Freq: Once | INTRAMUSCULAR | Status: AC
  Administered 2011-11-18: 10 mg via INTRAVENOUS
  Filled 2011-11-18 (×2): qty 0.5

## 2011-11-18 NOTE — Progress Notes (Signed)
Patient ID: David Morse, male   DOB: 09-03-1966, 46 y.o.   MRN: [id number] Subjective:  As above  Objective: Vital signs in last 24 hours: Temp:  [97.7 F (36.5 C)-99.2 F (37.3 C)] 98 F (36.7 C) (02/12 0600) Pulse Rate:  [89-109] 97  (02/12 0600) Resp:  [16-28] 16  (02/12 0600) BP: (146-186)/(70-98) 161/98 mmHg (02/12 0600) SpO2:  [50 %-96 %] 94 % (02/12 0600) Weight:  [104.781 kg (231 lb)] 104.781 kg (231 lb) (02/12 0200)  Intake/Output from previous day: 02/11 0701 - 02/12 0700 In: 1300 [I.V.:1300] Out: 250 [Urine:150; Blood:100] Intake/Output this shift:    Physical exam the patient's dressing has a small amount of old blood staining. There is no evidence of hematoma or shift.  Lab Results: No results found for this basename: WBC:2,HGB:2,HCT:2,PLT:2 in the last 72 hours BMET No results found for this basename: NA:2,K:2,CL:2,CO2:2,GLUCOSE:2,BUN:2,CREATININE:2,CALCIUM:2 in the last 72 hours  Studies/Results: Dg Cervical Spine 2-3 Views  11/17/2011  *RADIOLOGY REPORT*  Clinical Data: C3-C4 ACDF  CERVICAL SPINE - 2-3 VIEW  Comparison: None.  Findings: Film number one is a limited single lateral view of the cervical spine that demonstrates a probe in place at the C3-C4 interspace.  Film #2 demonstrates postoperative changes of ACDF at C3-C4, with an interbody graft in place.  Original Report Authenticated By: Florencia Reasons, M.D.    Assessment/Plan: I'll ask. the nurses to change his dressing.  LOS: 1 day     Sumi Lye D 11/18/2011, 7:34 AM

## 2011-11-18 NOTE — Progress Notes (Signed)
Physical medicine and rehabilitation consult the chart reviewed. Physical and occupational therapy evaluations completed both recommending home health therapies. All issues in regards to this have been discussed with patient. Will discuss with case manager. Any issues with change in regards to these recommendations please reconsult.

## 2011-11-18 NOTE — Evaluation (Signed)
Physical Therapy Evaluation Patient Details Name: David Morse MRN: [id number] DOB: 1966-07-14 Today's Date: 11/18/2011  Problem List: There is no problem list on file for this patient.   Past Medical History:  Past Medical History  Diagnosis Date  . Pneumonia   . Hypertension    Past Surgical History:  Past Surgical History  Procedure Date  . Circumcision     [facility] in [place]    PT Assessment/Plan/Recommendation PT Assessment Clinical Impression Statement: 46 year old s/p cervical fusion. Pt presents with impaired balance and decreased reactive reactions without UE support requiring assist to prevent falling. Pt does well with RW. Anticipate quick progress.  PT Recommendation/Assessment: Patient will need skilled PT in the acute care venue PT Problem List: Decreased activity tolerance;Decreased balance;Decreased mobility;Decreased knowledge of use of DME;Pain PT Therapy Diagnosis : Difficulty walking;Abnormality of gait;Acute pain PT Plan PT Frequency: Min 6X/week PT Treatment/Interventions: DME instruction;Gait training;Stair training;Functional mobility training;Therapeutic activities;Therapeutic exercise;Balance training;Patient/family education PT Recommendation Follow Up Recommendations: Home health PT Equipment Recommended: Tub/shower seat;Rolling walker with 5" wheels (RW if pt's mothers does not fit him) PT Goals  Acute Rehab PT Goals PT Goal Formulation: With patient Time For Goal Achievement: 2 weeks Pt will Roll Supine to Left Side: Independently PT Goal: Rolling Supine to Left Side - Progress: Goal set today Pt will go Supine/Side to Sit: Independently PT Goal: Supine/Side to Sit - Progress: Goal set today Pt will go Sit to Supine/Side: Independently PT Goal: Sit to Supine/Side - Progress: Goal set today Pt will go Sit to Stand: with modified independence PT Goal: Sit to Stand - Progress: Goal set today Pt will go Stand to Sit: with modified  independence PT Goal: Stand to Sit - Progress: Goal set today Pt will Ambulate: >150 feet;with modified independence;with least restrictive assistive device PT Goal: Ambulate - Progress: Goal set today Pt will Go Up / Down Stairs: 3-5 stairs;with modified independence;with least restrictive assistive device PT Goal: Up/Down Stairs - Progress: Goal set today  PT Evaluation Precautions/Restrictions  Precautions Precautions: Other (comment) (Neck) Precaution Comments: Verbally reviewed neck precautions Required Braces or Orthoses: Yes Cervical Brace: Hard collar Restrictions Weight Bearing Restrictions: No Prior Functioning  Home Living Lives With: Significant other (girlfriend works) Actor Help From: Family Type of Home: House Home Layout: One level Home Access: Stairs to enter Entrance Stairs-Rails: Lawyer of Steps: 3 Bathroom Shower/Tub: Engineer, manufacturing systems: Standard Bathroom Accessibility: Yes How Accessible: Accessible via walker Home Adaptive Equipment: Bedside commode/3-in-1;Walker - rolling;Shower chair without back Additional Comments: will likely stay with mother and brother due to girlfriend working Prior Function Level of Independence: Requires assistive device for independence;Needs assistance with ADLs Bath: Moderate Dressing: Moderate Able to Take Stairs?: Yes Driving: Yes (very little) Vocation: Full time employment 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 LEs) Proprioception: Appears Intact Coordination Gross Motor Movements are Fluid and Coordinated: Yes Fine Motor Movements are Fluid and  Coordinated: No (coordination mildly impaired bil hands) Extremity Assessment RUE Assessment RUE Assessment: Within Functional Limits (strength at least 4/5 throughout) LUE Assessment LUE Assessment: Within Functional Limits (Strength at least 4/5 throughout) RLE Assessment RLE Assessment: Within Functional Limits LLE Assessment LLE Assessment: Within Functional Limits Mobility (including Balance) Bed Mobility Bed Mobility: No (seated at start)  Rolling Right: 7: Independent Right Sidelying to Sit: 6: Modified independent (Device/Increase time);With rails Transfers Transfers: Yes Sit to Stand: 4: Min assist Sit to Stand Details (indicate cue type and reason): Anterior/posterior sway with standing.  Stand to Sit: 4: Min assist Stand to Sit Details: (A) secondary to increased sway- to control descent.  Ambulation/Gait Ambulation/Gait: Yes Ambulation/Gait Assistance: 3: Mod assist;4: Min assist Ambulation/Gait Assistance Details (indicate cue type and reason): Moderate assist without device, very wide base of support, increased sway. Intermittant assist with RW secondary to mild increased sway. Verbal cues for upright posture and appropriate positioning of RW.  Ambulation Distance (Feet): 90 Feet (with one seated rest) Assistive device: Rolling walker;None Gait Pattern: Step-through pattern;Decreased stride length;Trunk flexed (wide base of support, decreased foot clearance) Stairs: No  Posture/Postural Control Posture/Postural Control: No significant limitations Balance Balance Assessed: Yes Static Sitting Balance Static Sitting - Balance Support: No upper extremity supported Static Sitting - Level of Assistance: 7: Independent Static Standing Balance Static Standing - Balance Support: No upper extremity supported;Bilateral upper extremity supported Static Standing - Level of Assistance: 5: Stand by assistance;4: Min assist Static Standing - Comment/# of Minutes: Stand by assist  with UE support, min assist with no UE support.  Exercise  General Exercises - Lower Extremity Long Arc Quad: AROM;Both;10 reps;Seated (with dorsiflexion) End of Session PT - End of Session Equipment Utilized During Treatment: Cervical collar Activity Tolerance: Patient tolerated treatment well Patient left: in chair Nurse Communication: Mobility status for transfers General Behavior During Session: [facility] for tasks performed Cognition: [facility] for tasks performed  Sherie Don 11/18/2011, 1:19 PM  Sherie Don) Carleene Mains PT, DPT Acute Rehabilitation [phone]

## 2011-11-18 NOTE — Progress Notes (Signed)
Changed the dressing & its dry, intact with out any bleeding. The surgical site is healing good with a small wet spot.      Pt has sputum with dark blood stain, but it is not fresh blood.  Pt is stable without any discomfort.

## 2011-11-18 NOTE — Progress Notes (Signed)
Utilization review completed. Kandee Escalante, RN, BSN. 11/18/11 

## 2011-11-18 NOTE — Progress Notes (Signed)
Patient ID: David Morse, male   DOB: Mar 25, 1966, 46 y.o.   MRN: [id number] Subjective:  The patient is alert and pleasant. He looks well.  Objective: Vital signs in last 24 hours: Temp:  [97.7 F (36.5 C)-99.2 F (37.3 C)] 98 F (36.7 C) (02/12 0600) Pulse Rate:  [89-109] 97  (02/12 0600) Resp:  [16-28] 16  (02/12 0600) BP: (146-186)/(70-98) 161/98 mmHg (02/12 0600) SpO2:  [50 %-96 %] 94 % (02/12 0600) Weight:  [104.781 kg (231 lb)] 104.781 kg (231 lb) (02/12 0200)  Intake/Output from previous day: 02/11 0701 - 02/12 0700 In: 1300 [I.V.:1300] Out: 250 [Urine:150; Blood:100] Intake/Output this shift:    Physical exam the patient is moving all 4 extremities well. His hands continue to be weak.  Lab Results: No results found for this basename: WBC:2,HGB:2,HCT:2,PLT:2 in the last 72 hours BMET No results found for this basename: NA:2,K:2,CL:2,CO2:2,GLUCOSE:2,BUN:2,CREATININE:2,CALCIUM:2 in the last 72 hours  Studies/Results: Dg Cervical Spine 2-3 Views  11/17/2011  *RADIOLOGY REPORT*  Clinical Data: C3-C4 ACDF  CERVICAL SPINE - 2-3 VIEW  Comparison: None.  Findings: Film number one is a limited single lateral view of the cervical spine that demonstrates a probe in place at the C3-C4 interspace.  Film #2 demonstrates postoperative changes of ACDF at C3-C4, with an interbody graft in place.  Original Report Authenticated By: Florencia Reasons, M.D.    Assessment/Plan: Postop day 1: The last the rehabilitation team to see the patient for inpatient rehabilitation.  Urinary retention: He has been I and O. cath.  LOS: 1 day     Tocarra Gassen D 11/18/2011, 7:33 AM

## 2011-11-18 NOTE — Evaluation (Signed)
Occupational Therapy Evaluation Patient Details Name: David Morse MRN: [id number] DOB: 08/31/1966 Today's Date: 11/18/2011  Problem List: There is no problem list on file for this patient.   Past Medical History:  Past Medical History  Diagnosis Date  . Pneumonia   . Hypertension    Past Surgical History:  Past Surgical History  Procedure Date  . Circumcision     [place] in [place]    OT Assessment/Plan/Recommendation OT Assessment Clinical Impression Statement: This 46 y.o. male admitted for cervical fusion and pre--op UE weakness and impaired balance.  Pt. presents to OT with the below listed deficits.  Pt. will benefit from acute OT to allow pt. to return home with family at supervision level with BADLs  OT Recommendation/Assessment: Patient will need skilled OT in the acute care venue OT Problem List: Decreased strength;Impaired balance (sitting and/or standing);Decreased coordination;Decreased knowledge of use of DME or AE;Impaired UE functional use;Pain Barriers to Discharge: None OT Therapy Diagnosis : Generalized weakness;Acute pain OT Plan OT Frequency: Min 2X/week OT Treatment/Interventions: Self-care/ADL training;Therapeutic exercise;DME and/or AE instruction;Therapeutic activities;Patient/family education;Balance training OT Recommendation Follow Up Recommendations: No OT follow up Equipment Recommended: Tub/shower seat Individuals Consulted Consulted and Agree with Results and Recommendations: Patient OT Goals Acute Rehab OT Goals OT Goal Formulation: With patient Time For Goal Achievement: 2 weeks ADL Goals Pt Will Perform Grooming: with supervision;Standing at sink ADL Goal: Grooming - Progress: Goal set today Pt Will Perform Lower Body Bathing: with supervision;Sit to stand from chair;Sit to stand from bed ADL Goal: Lower Body Bathing - Progress: Goal set today Pt Will Perform Lower Body Dressing: with supervision;Sit to stand from chair;Sit to stand  from bed ADL Goal: Lower Body Dressing - Progress: Goal set today Pt Will Transfer to Toilet: with supervision;Ambulation;Comfort height toilet ADL Goal: Toilet Transfer - Progress: Goal set today Pt Will Perform Toileting - Clothing Manipulation: with supervision;Standing ADL Goal: Toileting - Clothing Manipulation - Progress: Goal set today Pt Will Perform Tub/Shower Transfer: Tub transfer;with min assist;Shower seat with back ADL Goal: Tub/Shower Transfer - Progress: Goal set today Additional ADL Goal #1: Pt. will be independent with theraputty exercise program for bil. UEs ADL Goal: Additional Goal #1 - Progress: Goal set today  OT Evaluation Precautions/Restrictions  Precautions Precautions: Other (comment) (Neck) Required Braces or Orthoses: Yes Cervical Brace: Hard collar Restrictions Weight Bearing Restrictions: No Prior Functioning Home Living Lives With: Significant other (will likely stay with mother and brother due to g-friend works) Actor Help From: Family Home Layout: One level Home Access: Stairs to enter Entrance Stairs-Rails: Lawyer of Steps: 3 Bathroom Shower/Tub: Forensic scientist: Standard Bathroom Accessibility: Yes How Accessible: Accessible via walker Home Adaptive Equipment: Bedside commode/3-in-1;Walker - rolling;Shower chair without back Prior Function Level of Independence: Needs assistance with ADLs;Needs assistance with homemaking;Requires assistive device for independence Bath: Moderate Dressing: Moderate Able to Take Stairs?: Yes Driving: Yes Vocation: Full time employment (on leave) Vocation Requirements: Drives a forklift ADL ADL Eating/Feeding: Simulated;Independent Where Assessed - Eating/Feeding: Bed level Grooming: Simulated;Wash/dry hands;Wash/dry face;Teeth care;Minimal assistance Where Assessed - Grooming: Standing at sink Upper Body Bathing: Simulated;Set up Where Assessed -  Upper Body Bathing: Unsupported;Sitting, bed Lower Body Bathing: Simulated;Minimal assistance Where Assessed - Lower Body Bathing: Sit to stand from bed Upper Body Dressing: Simulated;Set up Where Assessed - Upper Body Dressing: Unsupported;Sitting, bed Lower Body Dressing: Simulated;Minimal assistance Lower Body Dressing Details (indicate cue type and reason): Pt. able to don/doff socks independently Where Assessed - Lower Body  Dressing: Sit to stand from bed Toilet Transfer: Performed;Minimal assistance Toilet Transfer Method: Ambulating Toilet Transfer Equipment: Comfort height toilet;Grab bars Toileting - Clothing Manipulation: Performed;Minimal assistance Where Assessed - Toileting Clothing Manipulation: Standing Toileting - Hygiene: Simulated;Independent Where Assessed - Toileting Hygiene: Sit on 3-in-1 or toilet Ambulation Related to ADLs: Pt. requested to try to ambulate without RW.  Pushed IV pole into BR, required min A for balance ADL Comments: Pt. reports he feels significantly stronger and is able to do more now than prior to surgery.  Pt. eager to regain independence.  Pt. was provided with red theraputty and instructed in grip and pinch exercises Vision/Perception    Cognition Cognition Arousal/Alertness: Awake/alert Overall Cognitive Status: Appears within functional limits for tasks assessed Orientation Level: Oriented X4 Sensation/Coordination Coordination Gross Motor Movements are Fluid and Coordinated: Yes Fine Motor Movements are Fluid and Coordinated: No (coordination mildly impaired bil hands) Extremity Assessment RUE Assessment RUE Assessment: Within Functional Limits (strength at least 4/5 throughout) LUE Assessment LUE Assessment: Within Functional Limits (Strength at least 4/5 throughout) Mobility  Bed Mobility Bed Mobility: Yes Rolling Right: 7: Independent Right Sidelying to Sit: 6: Modified independent (Device/Increase time);With  rails Transfers Transfers: Yes Sit to Stand: 4: Min assist Stand to Sit: 4: Min assist Exercises   End of Session OT - End of Session Equipment Utilized During Treatment: Cervical collar Activity Tolerance: Patient tolerated treatment well Patient left: in bed;with call bell in reach Nurse Communication: Other (comment) (Need for pain medication) General Behavior During Session: [facility] for tasks performed Cognition: [facility] for tasks performed   Warrene Kapfer M 11/18/2011, 12:42 PM

## 2011-11-19 MED ORDER — HYDROMORPHONE HCL PF 1 MG/ML IJ SOLN
INTRAMUSCULAR | Status: AC
  Administered 2011-11-19: 1 mg via INTRAVENOUS
  Filled 2011-11-19: qty 1

## 2011-11-19 MED ORDER — HYDROMORPHONE HCL PF 1 MG/ML IJ SOLN
1.0000 mg | INTRAMUSCULAR | Status: DC | PRN
  Administered 2011-11-19 – 2011-11-21 (×7): 1 mg via INTRAVENOUS
  Filled 2011-11-19 (×7): qty 1

## 2011-11-19 NOTE — Progress Notes (Signed)
Patient ID: David Morse, male   DOB: [birth year], 46 y.o.   MRN: [id number] Subjective:  The patient is alert and pleasant. He looks well. His neck is appropriately sore. He wants to go home tomorrow.  Objective: Vital signs in last 24 hours: Temp:  [97.8 F (36.6 C)-98.5 F (36.9 C)] 97.8 F (36.6 C) (02/13 0853) Pulse Rate:  [84-97] 97  (02/13 0853) Resp:  [16-20] 20  (02/13 0853) BP: (152-202)/(80-101) 176/101 mmHg (02/13 0853) SpO2:  [92 %-95 %] 92 % (02/13 0853)  Intake/Output from previous day:   Intake/Output this shift:    Physical exam the patient is alert and oriented x3 he is moving all 4 extremities well. His dressing is clean and dry there is no evidence of hematoma or shift.  Lab Results: No results found for this basename: WBC:2,HGB:2,HCT:2,PLT:2 in the last 72 hours BMET No results found for this basename: NA:2,K:2,CL:2,CO2:2,GLUCOSE:2,BUN:2,CREATININE:2,CALCIUM:2 in the last 72 hours  Studies/Results: Dg Cervical Spine 2-3 Views  11/17/2011  *RADIOLOGY REPORT*  Clinical Data: C3-C4 ACDF  CERVICAL SPINE - 2-3 VIEW  Comparison: None.  Findings: Film number one is a limited single lateral view of the cervical spine that demonstrates a probe in place at the C3-C4 interspace.  Film #2 demonstrates postoperative changes of ACDF at C3-C4, with an interbody graft in place.  Original Report Authenticated By: Florencia Reasons, M.D.    Assessment/Plan: Postop day #2: Patient seems to be doing well. Rehabilitation does not think he needs inpatient rehabilitation. I will tentatively send him home tomorrow.  LOS: 2 days     Marquelle Musgrave D 11/19/2011, 8:57 AM

## 2011-11-19 NOTE — Progress Notes (Signed)
Occupational Therapy Treatment Patient Details Name: David Morse MRN: [id number] DOB: 22-May-1966 Today's Date: 11/19/2011  OT Assessment/Plan OT Assessment/Plan Comments on Treatment Session: Pt progressing well towards goals.  Plans to d/c home tomorrow. OT Plan: Discharge plan remains appropriate OT Frequency: Min 2X/week Follow Up Recommendations: No OT follow up Equipment Recommended: Tub/shower seat;Rolling walker with 5" wheels OT Goals ADL Goals Pt Will Perform Lower Body Dressing: with supervision;Sit to stand from chair;Sit to stand from bed ADL Goal: Lower Body Dressing - Progress: Met Pt Will Transfer to Toilet: with supervision;Ambulation;Comfort height toilet ADL Goal: Toilet Transfer - Progress: Progressing toward goals Additional ADL Goal #1: Pt. will be independent with theraputty exercise program for bil. UEs ADL Goal: Additional Goal #1 - Progress: Progressing toward goals  OT Treatment Precautions/Restrictions  Precautions Precautions: Other (comment) (cervical) Precaution Comments: Verbally reviewed neck precautions Required Braces or Orthoses: Yes Cervical Brace: Hard collar Restrictions Weight Bearing Restrictions: No   ADL ADL Lower Body Dressing: Simulated;Supervision/safety Lower Body Dressing Details (indicate cue type and reason): Supervision for maintain cervical precautions when leaning posteriorly with legs crossed. Where Assessed - Lower Body Dressing: Sit to stand from bed Toilet Transfer: Simulated;Other (comment) (min guard assist) Toilet Transfer Method: Ambulating Equipment Used: Rolling walker ADL Comments: Pt reports that he will be going to stay with his mother initially. Pt's mother has a walk in shower with a seat.  Pt and significant other with questions about donning/doffing cervical collar.  Education provided on safely donning/doffing collar with correct technique in order to maintain cervical precautions and provide  support. Mobility  Bed Mobility Bed Mobility: Yes Rolling Right: 7: Independent Right Sidelying to Sit: 6: Modified independent (Device/Increase time);With rails Transfers Sit to Stand: Other (comment) (min guard) Sit to Stand Details (indicate cue type and reason): min guard for safety Stand to Sit: Other (comment) (min guard) Stand to Sit Details: Verbal cues for control of descent, UE placement Exercises  Pt able to perform theraputty exercises with min assist to provide VC for technique.    End of Session OT - End of Session Equipment Utilized During Treatment: Cervical collar Activity Tolerance: Patient tolerated treatment well Patient left: with call bell in reach;with family/visitor present;Other (comment) (sitting EOB) General Behavior During Session: [facility] for tasks performed Cognition: [facility] for tasks performed  David Morse, David Morse  11/19/2011, 3:35 PM 11/19/2011 David Morse OTR/L Pager [phone] Office [phone]

## 2011-11-19 NOTE — Progress Notes (Signed)
Pt expectorating blood tinged sputum.

## 2011-11-19 NOTE — Progress Notes (Signed)
Physical Therapy Treatment Patient Details Name: David Morse MRN: [id number] DOB: 21-Apr-1966 Today's Date: 11/19/2011  PT Assessment/Plan  PT - Assessment/Plan Comments on Treatment Session: 46 year old s/p cervical fusion. Pt continues to present with impaired balance and needs to use RW. Pt will need to return home to live with mother initially for 24 hour supervision.  PT Plan: Discharge plan remains appropriate PT Frequency: Min 6X/week Follow Up Recommendations: Home health PT Equipment Recommended: Tub/shower seat;Rolling walker with 5" wheels (RW if pt's mothers does not fit him) PT Goals  Acute Rehab PT Goals PT Goal Formulation: With patient Time For Goal Achievement: 2 weeks Pt will Roll Supine to Left Side: Independently PT Goal: Rolling Supine to Left Side - Progress: Progressing toward goal Pt will go Supine/Side to Sit: Independently PT Goal: Supine/Side to Sit - Progress: Progressing toward goal Pt will go Sit to Stand: with modified independence PT Goal: Sit to Stand - Progress: Progressing toward goal Pt will go Stand to Sit: with modified independence PT Goal: Stand to Sit - Progress: Progressing toward goal Pt will Ambulate: >150 feet;with modified independence;with least restrictive assistive device PT Goal: Ambulate - Progress: Progressing toward goal Pt will Go Up / Down Stairs: 3-5 stairs;with modified independence;with least restrictive assistive device PT Goal: Up/Down Stairs - Progress: Progressing toward goal  PT Treatment Precautions/Restrictions  Precautions Precautions: Other (comment) (Neck) Precaution Comments: Verbally reviewed neck precautions Required Braces or Orthoses: Yes Cervical Brace: Hard collar Restrictions Weight Bearing Restrictions: No Mobility (including Balance) Bed Mobility Bed Mobility: Yes Rolling Right: 7: Independent Right Sidelying to Sit: 6: Modified independent (Device/Increase time);With  rails Transfers Transfers: Yes Sit to Stand: Other (comment) (min-guard assist) Sit to Stand Details (indicate cue type and reason): min-guard assist for safety. Verbal cues for safety, making sure RW near by prior to standing.  Stand to Sit: Other (comment) (min-guard assist) Stand to Sit Details: Verbal cues for control of descent, UE placement Ambulation/Gait Ambulation/Gait: Yes Ambulation/Gait Assistance: 5: Supervision Ambulation/Gait Assistance Details (indicate cue type and reason): Close supervision for safety secondary occasional lateral loss of balance, no physical assist needed to correct. Pt needs to continue to use RW at this time as a result, pt aware. Repeated verbal cues for upright posture to decreased neck stress. Ambulation Distance (Feet): 175 Feet Assistive device: Rolling walker Gait Pattern: Step-through pattern;Decreased stride length;Trunk flexed Stairs: Yes Stairs Assistance: Other (comment) (min-guard assist) Stairs Assistance Details (indicate cue type and reason): Verbal cues for sequencing, safety.  Stair Management Technique: One rail Right Number of Stairs: 4     End of Session PT - End of Session Equipment Utilized During Treatment: Cervical collar Activity Tolerance: Patient tolerated treatment well (pt's HR elevated to 116 with ambulation) Patient left: in chair Nurse Communication: Mobility status for transfers (elevated HR) General Behavior During Session: [facility] for tasks performed Cognition: [facility] for tasks performed  Sherie Don 11/19/2011, 1:26 PM  Sherie Don) Carleene Mains PT, DPT Acute Rehabilitation [phone]

## 2011-11-19 NOTE — Progress Notes (Signed)
Clinical Social Worker received consult for "SNF." Currently PT is recommending home health PT. CSW met with pt, introduced herself, and explained role of social work. Pt lives with his girlfriend in [place]. Pt shared that he plans to return home at discharge. Pt plans to discharge tomorrow. CSW provided a list of home health agencies that service [facility]. RNCM is aware. CSW is signing off, please reconsult if a need arises prior to discharge.   Dede Query, MSW, Theresia Majors [phone]

## 2011-11-20 ENCOUNTER — Encounter (HOSPITAL_COMMUNITY): Payer: Self-pay | Admitting: Certified Registered"

## 2011-11-20 ENCOUNTER — Inpatient Hospital Stay (HOSPITAL_COMMUNITY): Payer: BC Managed Care – PPO | Admitting: Certified Registered"

## 2011-11-20 ENCOUNTER — Encounter (HOSPITAL_COMMUNITY)
Admission: RE | Disposition: A | Discharge status: Home / Self Care | Payer: Self-pay | Source: Ambulatory Visit | Attending: Neurosurgery

## 2011-11-20 HISTORY — PX: ANTERIOR CERVICAL DECOMP/DISCECTOMY FUSION: SHX1161

## 2011-11-20 SURGERY — ANTERIOR CERVICAL DECOMPRESSION/DISCECTOMY FUSION 1 LEVEL
Anesthesia: General | Site: Neck | Wound class: Clean

## 2011-11-20 MED ORDER — CHLORHEXIDINE GLUCONATE CLOTH 2 % EX PADS
6.0000 | MEDICATED_PAD | Freq: Every day | CUTANEOUS | Status: DC
  Administered 2011-11-20 – 2011-11-21 (×2): 6 via TOPICAL

## 2011-11-20 MED ORDER — PROPOFOL 10 MG/ML IV EMUL
INTRAVENOUS | Status: DC | PRN
  Administered 2011-11-20: 200 mg via INTRAVENOUS

## 2011-11-20 MED ORDER — SODIUM CHLORIDE 0.9 % IJ SOLN: 3.0000 mL | INTRAMUSCULAR | Status: DC | PRN

## 2011-11-20 MED ORDER — PHENYLEPHRINE HCL 10 MG/ML IJ SOLN
INTRAMUSCULAR | Status: DC | PRN
  Administered 2011-11-20 (×2): 40 ug via INTRAVENOUS

## 2011-11-20 MED ORDER — SODIUM CHLORIDE 0.9 % IV SOLN: 250.0000 mL | INTRAVENOUS | Status: DC

## 2011-11-20 MED ORDER — HYDROMORPHONE HCL PF 1 MG/ML IJ SOLN: 0.2500 mg | INTRAMUSCULAR | Status: DC | PRN

## 2011-11-20 MED ORDER — SUFENTANIL CITRATE 50 MCG/ML IV SOLN
INTRAVENOUS | Status: DC | PRN
  Administered 2011-11-20: 20 ug via INTRAVENOUS
  Administered 2011-11-20 (×2): 10 ug via INTRAVENOUS

## 2011-11-20 MED ORDER — MENTHOL 3 MG MT LOZG
1.0000 | LOZENGE | OROMUCOSAL | Status: DC | PRN
  Filled 2011-11-20: qty 9

## 2011-11-20 MED ORDER — ROCURONIUM BROMIDE 100 MG/10ML IV SOLN
INTRAVENOUS | Status: DC | PRN
  Administered 2011-11-20: 50 mg via INTRAVENOUS

## 2011-11-20 MED ORDER — ACETAMINOPHEN 650 MG RE SUPP: 650.0000 mg | RECTAL | Status: DC | PRN

## 2011-11-20 MED ORDER — ONDANSETRON HCL 4 MG/2ML IJ SOLN
INTRAMUSCULAR | Status: DC | PRN
  Administered 2011-11-20: 4 mg via INTRAVENOUS

## 2011-11-20 MED ORDER — SODIUM CHLORIDE 0.9 % IR SOLN
Status: DC | PRN
  Administered 2011-11-20: 15:00:00

## 2011-11-20 MED ORDER — CEFAZOLIN SODIUM 1-5 GM-% IV SOLN
1.0000 g | Freq: Three times a day (TID) | INTRAVENOUS | Status: AC
  Administered 2011-11-20 – 2011-11-21 (×2): 1 g via INTRAVENOUS
  Filled 2011-11-20 (×2): qty 50

## 2011-11-20 MED ORDER — ACETAMINOPHEN 325 MG PO TABS: 650.0000 mg | ORAL_TABLET | ORAL | Status: DC | PRN

## 2011-11-20 MED ORDER — ONDANSETRON HCL 4 MG/2ML IJ SOLN: 4.0000 mg | INTRAMUSCULAR | Status: DC | PRN

## 2011-11-20 MED ORDER — THROMBIN 5000 UNITS EX KIT
PACK | CUTANEOUS | Status: DC | PRN
  Administered 2011-11-20 (×2): 5000 [IU] via TOPICAL

## 2011-11-20 MED ORDER — PHENOL 1.4 % MT LIQD: 1.0000 | OROMUCOSAL | Status: DC | PRN

## 2011-11-20 MED ORDER — MIDAZOLAM HCL 5 MG/5ML IJ SOLN
INTRAMUSCULAR | Status: DC | PRN
  Administered 2011-11-20: 2 mg via INTRAVENOUS

## 2011-11-20 MED ORDER — GLYCOPYRROLATE 0.2 MG/ML IJ SOLN
INTRAMUSCULAR | Status: DC | PRN
  Administered 2011-11-20: .7 mg via INTRAVENOUS

## 2011-11-20 MED ORDER — LACTATED RINGERS IV SOLN
INTRAVENOUS | Status: DC | PRN
  Administered 2011-11-20: 15:00:00 via INTRAVENOUS

## 2011-11-20 MED ORDER — DEXAMETHASONE SODIUM PHOSPHATE 4 MG/ML IJ SOLN
4.0000 mg | Freq: Four times a day (QID) | INTRAMUSCULAR | Status: AC
  Administered 2011-11-20 (×2): 4 mg via INTRAVENOUS
  Administered 2011-11-20: 10 mg via INTRAVENOUS
  Filled 2011-11-20 (×4): qty 1

## 2011-11-20 MED ORDER — NEOSTIGMINE METHYLSULFATE 1 MG/ML IJ SOLN
INTRAMUSCULAR | Status: DC | PRN
  Administered 2011-11-20: 5 mg via INTRAVENOUS

## 2011-11-20 MED ORDER — SODIUM CHLORIDE 0.9 % IJ SOLN
3.0000 mL | Freq: Two times a day (BID) | INTRAMUSCULAR | Status: DC
  Administered 2011-11-20 – 2011-11-21 (×2): 3 mL via INTRAVENOUS

## 2011-11-20 MED ORDER — SUCCINYLCHOLINE CHLORIDE 20 MG/ML IJ SOLN
INTRAMUSCULAR | Status: DC | PRN
  Administered 2011-11-20: 100 mg via INTRAVENOUS

## 2011-11-20 MED ORDER — SODIUM CHLORIDE 0.9 % IV SOLN
INTRAVENOUS | Status: AC
  Filled 2011-11-20: qty 500

## 2011-11-20 MED ORDER — BACITRACIN 50000 UNITS IM SOLR
INTRAMUSCULAR | Status: AC
  Filled 2011-11-20: qty 1

## 2011-11-20 MED ORDER — DROPERIDOL 2.5 MG/ML IJ SOLN: 0.6250 mg | INTRAMUSCULAR | Status: DC | PRN

## 2011-11-20 MED ORDER — LABETALOL HCL 5 MG/ML IV SOLN
INTRAVENOUS | Status: DC | PRN
  Administered 2011-11-20 (×2): 5 mg via INTRAVENOUS

## 2011-11-20 MED ORDER — CEFAZOLIN SODIUM-DEXTROSE 2-3 GM-% IV SOLR
INTRAVENOUS | Status: AC
  Filled 2011-11-20: qty 50

## 2011-11-20 MED ORDER — 0.9 % SODIUM CHLORIDE (POUR BTL) OPTIME
TOPICAL | Status: DC | PRN
  Administered 2011-11-20: 1000 mL

## 2011-11-20 MED ORDER — MUPIROCIN 2 % EX OINT
1.0000 "application " | TOPICAL_OINTMENT | Freq: Two times a day (BID) | CUTANEOUS | Status: DC
  Administered 2011-11-20 – 2011-11-21 (×2): 1 via NASAL
  Filled 2011-11-20: qty 22

## 2011-11-20 SURGICAL SUPPLY — 56 items
BAG DECANTER FOR FLEXI CONT (MISCELLANEOUS) ×2 IMPLANT
BANDAGE GAUZE ELAST BULKY 4 IN (GAUZE/BANDAGES/DRESSINGS) IMPLANT
BIT DRILL NEURO 2X3.1 SFT TUCH (MISCELLANEOUS) ×1 IMPLANT
BUR BARREL STRAIGHT FLUTE 4.0 (BURR) ×2 IMPLANT
CANISTER SUCTION 2500CC (MISCELLANEOUS) ×2 IMPLANT
CLOTH BEACON ORANGE TIMEOUT ST (SAFETY) ×2 IMPLANT
CONT SPEC 4OZ CLIKSEAL STRL BL (MISCELLANEOUS) ×4 IMPLANT
DECANTER SPIKE VIAL GLASS SM (MISCELLANEOUS) ×2 IMPLANT
DERMABOND ADVANCED (GAUZE/BANDAGES/DRESSINGS) ×1
DERMABOND ADVANCED .7 DNX12 (GAUZE/BANDAGES/DRESSINGS) ×1 IMPLANT
DRAIN CHANNEL 7F 3/4 FLAT (WOUND CARE) ×2 IMPLANT
DRAPE LAPAROTOMY 100X72 PEDS (DRAPES) ×2 IMPLANT
DRAPE MICROSCOPE LEICA (MISCELLANEOUS) IMPLANT
DRAPE POUCH INSTRU U-SHP 10X18 (DRAPES) ×2 IMPLANT
DRESSING TELFA 8X3 (GAUZE/BANDAGES/DRESSINGS) ×2 IMPLANT
DRILL NEURO 2X3.1 SOFT TOUCH (MISCELLANEOUS) ×2
DRSG OPSITE 4X5.5 SM (GAUZE/BANDAGES/DRESSINGS) ×2 IMPLANT
DURAPREP 6ML APPLICATOR 50/CS (WOUND CARE) ×2 IMPLANT
ELECT REM PT RETURN 9FT ADLT (ELECTROSURGICAL) ×2
ELECTRODE REM PT RTRN 9FT ADLT (ELECTROSURGICAL) ×1 IMPLANT
EVACUATOR 1/8 PVC DRAIN (DRAIN) ×2 IMPLANT
GAUZE SPONGE 4X4 16PLY XRAY LF (GAUZE/BANDAGES/DRESSINGS) IMPLANT
GLOVE BIO SURGEON STRL SZ7.5 (GLOVE) IMPLANT
GLOVE BIOGEL PI IND STRL 6.5 (GLOVE) ×3 IMPLANT
GLOVE BIOGEL PI IND STRL 7.5 (GLOVE) IMPLANT
GLOVE BIOGEL PI IND STRL 8.5 (GLOVE) ×1 IMPLANT
GLOVE BIOGEL PI INDICATOR 6.5 (GLOVE) ×3
GLOVE BIOGEL PI INDICATOR 7.5 (GLOVE)
GLOVE BIOGEL PI INDICATOR 8.5 (GLOVE) ×1
GLOVE ECLIPSE 8.5 STRL (GLOVE) ×2 IMPLANT
GLOVE EXAM NITRILE LRG STRL (GLOVE) IMPLANT
GLOVE EXAM NITRILE MD LF STRL (GLOVE) IMPLANT
GLOVE EXAM NITRILE XL STR (GLOVE) IMPLANT
GLOVE EXAM NITRILE XS STR PU (GLOVE) IMPLANT
GLOVE INDICATOR 7.0 STRL GRN (GLOVE) ×4 IMPLANT
GOWN BRE IMP SLV AUR LG STRL (GOWN DISPOSABLE) ×4 IMPLANT
GOWN BRE IMP SLV AUR XL STRL (GOWN DISPOSABLE) ×2 IMPLANT
GOWN STRL REIN 2XL LVL4 (GOWN DISPOSABLE) ×2 IMPLANT
HEAD HALTER (SOFTGOODS) ×2 IMPLANT
KIT BASIN OR (CUSTOM PROCEDURE TRAY) ×2 IMPLANT
KIT ROOM TURNOVER OR (KITS) ×2 IMPLANT
NEEDLE HYPO 22GX1.5 SAFETY (NEEDLE) ×2 IMPLANT
NEEDLE SPNL 22GX3.5 QUINCKE BK (NEEDLE) ×2 IMPLANT
NS IRRIG 1000ML POUR BTL (IV SOLUTION) ×2 IMPLANT
PACK LAMINECTOMY NEURO (CUSTOM PROCEDURE TRAY) ×2 IMPLANT
PAD ARMBOARD 7.5X6 YLW CONV (MISCELLANEOUS) ×6 IMPLANT
RUBBERBAND STERILE (MISCELLANEOUS) IMPLANT
SPONGE GAUZE 4X4 12PLY (GAUZE/BANDAGES/DRESSINGS) ×2 IMPLANT
SPONGE INTESTINAL PEANUT (DISPOSABLE) ×2 IMPLANT
SPONGE SURGIFOAM ABS GEL SZ50 (HEMOSTASIS) ×2 IMPLANT
SUT VIC AB 3-0 SH 8-18 (SUTURE) ×2 IMPLANT
SYR 20ML ECCENTRIC (SYRINGE) ×2 IMPLANT
TAPE CLOTH SURG 4X10 WHT LF (GAUZE/BANDAGES/DRESSINGS) ×2 IMPLANT
TOWEL OR 17X24 6PK STRL BLUE (TOWEL DISPOSABLE) ×2 IMPLANT
TOWEL OR 17X26 10 PK STRL BLUE (TOWEL DISPOSABLE) ×2 IMPLANT
WATER STERILE IRR 1000ML POUR (IV SOLUTION) ×2 IMPLANT

## 2011-11-20 NOTE — Progress Notes (Signed)
PT Cancellation Note  Treatment cancelled today due to scheduled return to the operating room for evacuation of hematoma.  Will attempt to see tomorrow if appropriate.    Lara Mulch 11/20/2011, 11:57 AM [phone]

## 2011-11-20 NOTE — Progress Notes (Signed)
Pt placed on CPAP 10cm H20 for approx 5 minutes. Pt woke up, trying to pull mask off. Placed back on 10 L simple mask that was previously on from OR, HR 83, sat 96%, RR 22. No distress noted at this time, will cont to monitor.  Inocente Salles RRT,RCP 11/20/2011 5:36 PM

## 2011-11-20 NOTE — Anesthesia Preprocedure Evaluation (Addendum)
Anesthesia Evaluation  Patient identified by MRN, date of birth, ID band Patient awake    Reviewed: Allergy & Precautions, H&P , NPO status , Patient's Chart, lab work & pertinent test results, reviewed documented beta blocker date and time   Airway Mallampati: III TM Distance: <3 FB Neck ROM: Limited  Mouth opening: Limited Mouth Opening Comment: Operative site swollen. Pt having difficulty swallowing and some difficulty breathing. Plan RSI w/ [facility]  (+) Teeth Intact and Dental Advisory Given   Pulmonary Current Smoker,    Pulmonary exam normal       Cardiovascular hypertension, Pt. on medications     Neuro/Psych    GI/Hepatic   Endo/Other    Renal/GU      Musculoskeletal   Abdominal   Peds  Hematology   Anesthesia Other Findings   Reproductive/Obstetrics                         Anesthesia Physical Anesthesia Plan  ASA: III and Emergent  Anesthesia Plan: General   Post-op Pain Management:    Induction: Intravenous and Rapid sequence  Airway Management Planned: Oral ETT  Additional Equipment:   Intra-op Plan:   Post-operative Plan: Extubation in OR  Informed Consent: I have reviewed the patients History and Physical, chart, labs and discussed the procedure including the risks, benefits and alternatives for the proposed anesthesia with the patient or authorized representative who has indicated his/her understanding and acceptance.     Plan Discussed with: CRNA, Anesthesiologist and Surgeon  Anesthesia Plan Comments: (Airway exam shows asymmetrical swelling of the left neck.  No difficulty breathing or phonation.  Potential for difficult intubation due to post op swelling.  Will plan for RSI and have glidescope and difficult airway cart with extra help in the room.  Neck is not tight and patient is able to lie flat without distress. )        Anesthesia Quick Evaluation

## 2011-11-20 NOTE — Transfer of Care (Signed)
Immediate Anesthesia Transfer of Care Note  Patient: David Morse  Procedure(s) Performed: Procedure(s) (LRB): ANTERIOR CERVICAL DECOMPRESSION/DISCECTOMY FUSION 1 LEVEL (N/A)  Patient Location: PACU  Anesthesia Type: General  Level of Consciousness: sedated  Airway & Oxygen Therapy: Patient Spontanous Breathing and Patient connected to face mask  Post-op Assessment: Report given to PACU RN, Post -op Vital signs reviewed and stable and Patient moving all extremities X 4  Post vital signs: Reviewed and stable  Complications: No apparent anesthesia complications

## 2011-11-20 NOTE — Progress Notes (Signed)
Patient ID: David Morse, male   DOB: August 07, 1966, 46 y.o.   MRN: [id number] Postop evacuation of hematoma patient feels much better able to swallow and is very hungry. Voice is still very hoarse but patient feals much better. Drain with minimal output. Neurologically stable

## 2011-11-20 NOTE — Progress Notes (Signed)
Subjective: Patient reports Difficulty swallowing difficulty with breathing unable to get even simple liquids down coarseness of voice upper extremity function with little change.  Objective: Vital signs in last 24 hours: Temp:  [97.9 F (36.6 C)-99.2 F (37.3 C)] 98 F (36.7 C) (02/14 0900) Pulse Rate:  [92-105] 102  (02/14 0900) Resp:  [20] 20  (02/14 0900) BP: (135-197)/(85-111) 154/92 mmHg (02/14 0900) SpO2:  [92 %-98 %] 95 % (02/14 0900)  Intake/Output from previous day: 02/13 0701 - 02/14 0700 In: 480 [P.O.:480] Out: -  Intake/Output this shift:    Large cutaneous mass under area of surgical incision causing obvious tracheal deviation to the right side. Upper extremity strength reveals decreased grip to 4 minus out of 5 decreased biceps strength but increased tone in biceps triceps and deltoids. Lower extremity function appears intact.  Lab Results: No results found for this basename: WBC:2,HGB:2,HCT:2,PLT:2 in the last 72 hours BMET No results found for this basename: NA:2,K:2,CL:2,CO2:2,GLUCOSE:2,BUN:2,CREATININE:2,CALCIUM:2 in the last 72 hours  Studies/Results: No results found.  Assessment/Plan: A postoperative hematoma and cervical spine  LOS: 3 days  Schedule return to the operating room for evacuation of hematoma. Patient on high-dose steroids currently.   Ansleigh Safer J 11/20/2011, 10:13 AM

## 2011-11-20 NOTE — Progress Notes (Addendum)
Pt c/o pain at incision site with difficulty swallowing. MD called and ordered was given. Medication given to patient with no relief. I assessed neck incision with swelling and ? Hematoma. MD notified and Dr. Danielle Dess came to assess. Surgery planned for this afternoon. I will continue to monitor patient. Rema Fendt, RN

## 2011-11-20 NOTE — Op Note (Signed)
Procedure: Drainage of surgical hematoma of the neck status post anterior cervical decompression Preoperative diagnosis: Postoperative wound hematoma status post anterior cervical discectomy Postoperative diagnosis: Postoperative wound hematoma status post anterior cervical discectomy Surgeon: Barnett Abu Anesthesia Gen. endotracheal Indications: Mr. David Morse is a 47 year old individual who underwent a two-level anterior cervical decompression yesterday. Overnight he described some increasing difficulty with swallowing and breathing and this morning he was noted to have a large mass underneath this surgical site yet obvious tracheal deviation. He could not swallow his secretions. He is having some modest difficulty with breathing and his voice was extremely hoarse. He was advised that he would need to have a surgical hematoma drained at the surgical site.  Procedure: The patient was brought to the operating room placed on table in supine position after the smooth induction of general endotracheal anesthesia his neck was prepped with alcohol and DuraPrep. All Steri-Strips were removed. His incision was reopened using Metzenbaum scissors to cut the subcuticular sutures. As the muscular layer was cut a large IMA, consistent with current jelly blood clot was encountered this was resected in a piecemeal fashion. Portions of the clot tract behind the region of the trachea were deviating the esophagus and extended cephalad into the hypopharynx. These were all removed. The wound was irrigated copiously. In the depths of the wound there was noted to be significant edema in the left sided longus coli muscle. There is also a significant hemorrhagic encounter here. The muscle was explored carefully but no discrete bleeding site could be identified. The right paraspinous muscle muscle was not nearly involved in this fashion. After exploration of all aspects of the wound no discrete bleeding points were encountered. A 7 mm  flat Jackson-Pratt drain was then run in through a separate stab incision and connected to bulb suction. The wound was then closed with 3-0 Vicryl in an interrupted fashion in the platysma and 3-0 Vicryl in the subcuticular skin. A dry gauze dressing was applied. Blood loss for the procedure was nil.

## 2011-11-20 NOTE — Progress Notes (Signed)
OT Cancellation Note  Treatment cancelled today due to scheduled return to the operating room for evacuation of hematoma. Will attempt to see tomorrow if appropriate.    11/20/2011 Cipriano Mile OTR/L Pager [phone] Office [phone]

## 2011-11-20 NOTE — Anesthesia Postprocedure Evaluation (Signed)
  Anesthesia Post-op Note  Patient: David Morse  Procedure(s) Performed: Procedure(s) (LRB): ANTERIOR CERVICAL DECOMPRESSION/DISCECTOMY FUSION 1 LEVEL (N/A)  Patient Location: PACU  Anesthesia Type: General  Level of Consciousness: awake  Airway and Oxygen Therapy: Patient Spontanous Breathing  Post-op Pain: mild  Post-op Assessment: Post-op Vital signs reviewed  Post-op Vital Signs: stable  Complications: No apparent anesthesia complications

## 2011-11-21 MED ORDER — OXYCODONE-ACETAMINOPHEN 5-325 MG PO TABS: 1.0000 | ORAL_TABLET | ORAL | Status: AC | PRN

## 2011-11-21 MED ORDER — DIAZEPAM 5 MG PO TABS: 5.0000 mg | ORAL_TABLET | Freq: Four times a day (QID) | ORAL | Status: AC | PRN

## 2011-11-21 NOTE — Progress Notes (Signed)
Physical Therapy Treatment Patient Details Name: David Morse MRN: [id number] DOB: 28-Nov-1965 Today's Date: 11/21/2011  PT Assessment/Plan  PT - Assessment/Plan Comments on Treatment Session: Pt progressing well. Pt still with decreased stability and balance without RW. Encouraged pt to use RW upon d/c home today. PT Plan: Discharge plan remains appropriate PT Frequency: Min 6X/week Follow Up Recommendations: Home health PT Equipment Recommended: Tub/shower seat;Rolling walker with 5" wheels PT Goals  Acute Rehab PT Goals PT Goal Formulation: With patient PT Goal: Sit to Stand - Progress: Progressing toward goal PT Goal: Stand to Sit - Progress: Progressing toward goal PT Goal: Ambulate - Progress: Progressing toward goal  PT Treatment Precautions/Restrictions  Precautions Precautions: Other (comment) (cervical) Precaution Comments: Pt able to demonstrate proper neck precautions Required Braces or Orthoses: No (MD d/c'ed brace) Cervical Brace: Hard collar Restrictions Weight Bearing Restrictions: No Mobility (including Balance) Bed Mobility Bed Mobility: No (pt sitting at EOB upon arrival) Transfers Transfers: Yes Sit to Stand: 5: Supervision;With upper extremity assist;From bed Sit to Stand Details (indicate cue type and reason): VC for hand placement. Pt uses momentum to assist with standing, encouraged forward movement to ease stand Stand to Sit: 5: Supervision;With upper extremity assist;To bed;To chair/3-in-1 Stand to Sit Details: VC for hand placement for safety Ambulation/Gait Ambulation/Gait: Yes Ambulation/Gait Assistance: 5: Supervision;3: Mod assist Ambulation/Gait Assistance Details (indicate cue type and reason): Supervision with RW throughout ambulation. Attempted ambulation without device and 1 person H; pt required mod assist. Pt with wide base of support throughout ambulation with increased sway and weight shifting. VC throughout with RW for distance for  safety Ambulation Distance (Feet): 150 Feet Assistive device: Rolling walker;1 person hand held assist Gait Pattern: Step-through pattern;Decreased stride length;Trunk flexed Gait velocity: Decreased gait speed Stairs: No    Exercise    End of Session PT - End of Session Equipment Utilized During Treatment: Gait belt Activity Tolerance: Patient tolerated treatment well Patient left: in chair;with call bell in reach Nurse Communication: Mobility status for transfers;Mobility status for ambulation General Behavior During Session: [facility] for tasks performed Cognition: [facility] for tasks performed  Milana Kidney 11/21/2011, 10:24 AM  11/21/2011 Milana Kidney DPT PAGER: [phone] OFFICE: [phone]

## 2011-11-21 NOTE — Discharge Summary (Signed)
Physician Discharge Summary  Patient ID: David Morse MRN: [id number] DOB/AGE: 1965/11/02 45 y.o.  Admit date: 11/17/2011 Discharge date: 11/21/2011  Admission Diagnoses: Cervical spondylosis with myelopathy C3-C4  Discharge Diagnoses: Cervical spondylosis with myelopathy C3-C4 Postoperative wound hematoma  Active Problems:  * No active hospital problems. *    Discharged Condition: fair  Hospital Course: Patient was admitted to undergo surgical decompression at C3-C4 secondary to severe stenosis with myelopathy. Postoperatively the patient initially felt well however overnight he developed a significant mass underneath his incision. By morning he could not swallow is having some difficulty breathing and it is evident that the patient had accumulated a significant quantity of fluid likely hematoma in the wound area and is taken to the operating room that afternoon where he had evacuation of a large hematoma of the neck wound. The hardware was not disrupted no discrete bleeding points found postoperatively however the patient felt much improved was able to swallow and breathe normally and is discharged at this time.  Consults: None  Significant Diagnostic Studies: None  Treatments: surgery: Anterior cervical decompression C3-C4 arthrodesis with structural allograft, evacuation of postoperative wound hematoma.  Discharge Exam: Blood pressure 190/95, pulse 96, temperature 98.3 F (36.8 C), temperature source Oral, resp. rate 13, height 5\' 11"  (1.803 m), weight 104.781 kg (231 lb), SpO2 96.00%. At discharge the patient has motor strength that is graded at 4/5 in the upper extremities he has some slowness of movement some increased tone in the distal upper extremity musculature. His incision however is clean and dry he is swallowing well  Disposition: Discharge home  Discharge Orders    Future Orders Please Complete By Expires   Diet - low sodium heart healthy      Increase activity  slowly      Discharge instructions      Comments:   Ambulate as tolerated. Okay to shower. Paint incision with Betadine after shower. No vigorous upper extremity activity.   Call MD for:  temperature >100.4      Call MD for:  severe uncontrolled pain      Call MD for:  redness, tenderness, or signs of infection (pain, swelling, redness, odor or green/yellow discharge around incision site)        Medication List  As of 11/21/2011 11:56 AM   TAKE these medications         amLODipine 5 MG tablet   Commonly known as: NORVASC   Take 5 mg by mouth daily.      diazepam 5 MG tablet   Commonly known as: VALIUM   Take 1 tablet (5 mg total) by mouth every 6 (six) hours as needed (Muscle spasm).      doxycycline 100 MG tablet   Commonly known as: VIBRA-TABS   Take 100 mg by mouth 2 (two) times daily.      lisinopril-hydrochlorothiazide 20-12.5 MG per tablet   Commonly known as: PRINZIDE,ZESTORETIC   Take 1 tablet by mouth daily.      oxyCODONE-acetaminophen 5-325 MG per tablet   Commonly known as: PERCOCET   Take 1-2 tablets by mouth every 4 (four) hours as needed.             SignedStefani Dama 11/21/2011, 11:56 AM

## 2011-11-21 NOTE — Progress Notes (Signed)
Utilization review completed. Mekenna Finau, RN, BSN. 11/21/11  

## 2011-11-21 NOTE — Progress Notes (Signed)
Discharge instructions reviewed with pt & signed.  Pt belongings, copy of d/c instructions, and d/c prescriptions with patient - discharged via wheelchair through [facility] entrance.

## 2011-11-21 NOTE — Discharge Instructions (Signed)
Anterior Cervical Discectomy and Fusion °Anterior cervical discectomy is surgery done on the upper spine to relieve pressure on one or more nerve roots, or on the spinal cord. There are 7 bones in your neck, called the cervical spine. These 7 bones (vertebrae) sit one on top of the other. Cushions (intervertebral discs) separate the vertebrae and act like shock absorbers. As we age, degeneration of our bones, joints, and disks can cause neck pain and tightening around the spinal cord and nerve roots. This causes arm pain and weakness.  °Degeneration involves: °· Herniated Disk. With age, the disks dry up and can rupture. In this condition, the center of the disc bulges out (disk herniation). This can cause pressure on a nerve, which produces pain or weakness in the arm.  °· Bone spurs and spinal stenosis. As we age, growths often develop on our bones. These growths are called bone spurs (osteophytes). A bone spur is a collection of calcium. As bone spurs grow and extend, the vertebral openings become narrow. The spinal canal and/or the foramen (opening for nerve passageways) become smaller. This narrowing (stenosis) may cause pinching (compression) of the spinal cord or the spinal nerve root. The nerve injury can cause pain, weakness, numbness, and loss of coordination in the upper limbs. Often, patients have difficulty with their hand writing or they start dropping things, because their hand grip is weaker. The spinal cord damage can cause increased stiffness, more frequent falls, electric shooting pain, and changes in bowel and bladder control.  °Degeneration in the neck results in three common problems: °· Radiculopathy - Nerve compression that results in weakness or pain that radiates down the arm.  °· Myelopathy - Spinal cord compression that causes stiffness, difficulty with walking, coordination, and trouble with bowel or bladder habits.  °· Neck pain - Worn out joints cause pain as the neck moves.    °Treatment: °· Radiculopathy - Surgery is performed to remove the bony and disk material that is pushing on the nerve.  °· Myelopathy - Surgery is performed to remove the bony and disk material that pushes on the spinal cord.  °· Neck pain - Surgery is performed to combine (fuse) the joints of the neck together, so they cannot move or cause pain.  °Surgery can be done from the front or the back of the neck. When it is done from the front, it is called an anterior (front) cervical (neck) discectomy (removal of the disk) and fusion. °LET YOUR CAREGIVER KNOW ABOUT:  °· Recent infections.  °· Any shooting pains down your leg, when you move your neck.  °· Any difficulty swallowing.  °· If you smoke.  °· If you are taking any blood thinners or anti-inflammatory drugs.  °· Any history of injury to your shoulders.  °· Any history of injury to your vocal cords.  °· Any foreign objects in your body from a previous surgery.  °· Any recent fevers or illness.  °· Past medical history (diabetes, strokes).  °· Past problems with anesthetics.  °· Possibility of pregnancy.  °· History of blood clots (deep vein thrombosis).  °· History of bleeding or blood problems.  °· Past surgery.  °· Other health problems.  °· Allergies.  °· Medicines you take, including herbs, eye drops, over-the-counter medicines, and creams.  °· Use of steroids (by mouth or creams).  °RISKS AND COMPLICATIONS °· Infection.  °· Bleeding.  °· Injury to the following structures:  °· Carotid artery. This can result in a stroke   or significant amount of bleeding.  °· Esophageus, resulting in difficulty swallowing.  °· Recurrent laryngeal nerve, resulting in hoarseness of the voice.  °· Spinal cord injury, ranging from mild to complete quadriparesis (muscle weakness in all four limbs).  °· Nerve root injury, resulting in muscle weakness in the upper limb.  °· Leakage of cerebrospinal fluid.  °BEFORE THE PROCEDURE  °· You will be given medicine to help you sleep  (general anesthetic), and a breathing tube will be placed.  °· You will be given medicine to kill germs (antibiotics), to keep the infection rate down.  °· The incision site on your neck will be marked.  °· Your neck will be cleaned, to reduce the risk of infection.  °PROCEDURE  °An anterior cervical fusion means that the operation is done through the front (anterior) part of your neck. The cut made by the surgeon (incision) is usually within a skin fold line on the neck. After pushing aside the neck muscles, the surgeon removes the affected, degenerated disk and bone spurs (osteophytes), which takes the pressure off the nerves and spinal cord. This is called a decompression. The area where the disc was removed is then filled with a small piece of plastic. This plastic takes the place of the disk and keeps the nerve passageway (foramen) open and clear for the nerves. In most cases, the surgeon uses metal plates or pins (hardware) in the neck, to help stabilize the level being fused. The hardware reduces motion at that level, so it can fuse. This provides extra support to the neck. A cervical fusion procedure takes anywhere from a couple to several hours, depending on the size of the neck, history of previous surgery, and number of levels being fused. °AFTER THE PROCEDURE  °· You will likely spend 24-48 hours in the hospital. During this time, your caregivers will look for any signs of complications from the procedure.  °· Your caregiver will watch you, to make sure that fluid draining from the surgery slows down. It is important that a large mass of blood does not form in your neck, which would cause difficulty with breathing.  °· You will get 24 hours of germ killing medicine (antibiotics).  °· You can start to eat as soon as you feel comfortable.  °· Once you have started eating, walking, urinating (voiding) and having bowel movements on your own, your caregiver will discharge you home.  °HOME CARE INSTRUCTIONS    °· For 2 weeks, do not soak the incision site under water. Do not swim or take baths. Showers are ok, but rinse off the incision sites.  °· Do not over exert yourself. Allow time for the incision to heal.  °· It can take from 6 weeks to 6 months for fusion to take effect. Your caregiver may ask you to wear a neck collar during this time, as they check the fusion with multiple (serial) x-rays.  °Document Released: 09/10/2009 Document Revised: 06/04/2011 Document Reviewed: 09/10/2009 °[employer]® Patient Information ©2012 [employer]. °

## 2011-11-24 ENCOUNTER — Encounter (HOSPITAL_COMMUNITY): Payer: Self-pay | Admitting: Neurological Surgery

## 2011-12-05 NOTE — Anesthesia Postprocedure Evaluation (Signed)
  Anesthesia Post-op Note  Patient: David Morse  Procedure(s) Performed: Procedure(s) (LRB): ANTERIOR CERVICAL DECOMPRESSION/DISCECTOMY FUSION 1 LEVEL (N/A)  Patient Location: PACU  Anesthesia Type: General  Level of Consciousness: awake  Airway and Oxygen Therapy: Patient Spontanous Breathing  Post-op Pain: mild  Post-op Assessment: Post-op Vital signs reviewed  Post-op Vital Signs: stable  Complications: No apparent anesthesia complications 

## 2016-01-11 DIAGNOSIS — Z6837 Body mass index (BMI) 37.0-37.9, adult: Secondary | ICD-10-CM | POA: Diagnosis not present

## 2016-01-11 DIAGNOSIS — I10 Essential (primary) hypertension: Secondary | ICD-10-CM | POA: Diagnosis not present

## 2016-01-11 DIAGNOSIS — B358 Other dermatophytoses: Secondary | ICD-10-CM | POA: Diagnosis not present

## 2016-01-29 DIAGNOSIS — M542 Cervicalgia: Secondary | ICD-10-CM | POA: Diagnosis not present

## 2016-01-29 DIAGNOSIS — Z79899 Other long term (current) drug therapy: Secondary | ICD-10-CM | POA: Diagnosis not present

## 2016-01-29 DIAGNOSIS — G5600 Carpal tunnel syndrome, unspecified upper limb: Secondary | ICD-10-CM | POA: Diagnosis not present

## 2016-01-29 DIAGNOSIS — R208 Other disturbances of skin sensation: Secondary | ICD-10-CM | POA: Diagnosis not present

## 2016-04-14 DIAGNOSIS — K210 Gastro-esophageal reflux disease with esophagitis: Secondary | ICD-10-CM | POA: Diagnosis not present

## 2016-04-14 DIAGNOSIS — B358 Other dermatophytoses: Secondary | ICD-10-CM | POA: Diagnosis not present

## 2016-04-14 DIAGNOSIS — Z6836 Body mass index (BMI) 36.0-36.9, adult: Secondary | ICD-10-CM | POA: Diagnosis not present

## 2016-04-14 DIAGNOSIS — I10 Essential (primary) hypertension: Secondary | ICD-10-CM | POA: Diagnosis not present

## 2016-04-29 DIAGNOSIS — Z79891 Long term (current) use of opiate analgesic: Secondary | ICD-10-CM | POA: Diagnosis not present

## 2016-04-29 DIAGNOSIS — G4709 Other insomnia: Secondary | ICD-10-CM | POA: Diagnosis not present

## 2016-04-29 DIAGNOSIS — M542 Cervicalgia: Secondary | ICD-10-CM | POA: Diagnosis not present

## 2016-04-29 DIAGNOSIS — M797 Fibromyalgia: Secondary | ICD-10-CM | POA: Diagnosis not present

## 2016-05-21 DIAGNOSIS — Z79899 Other long term (current) drug therapy: Secondary | ICD-10-CM | POA: Diagnosis not present

## 2016-05-21 DIAGNOSIS — I10 Essential (primary) hypertension: Secondary | ICD-10-CM | POA: Diagnosis not present

## 2016-05-21 DIAGNOSIS — Z131 Encounter for screening for diabetes mellitus: Secondary | ICD-10-CM | POA: Diagnosis not present

## 2016-07-21 DIAGNOSIS — I10 Essential (primary) hypertension: Secondary | ICD-10-CM | POA: Diagnosis not present

## 2016-07-21 DIAGNOSIS — K210 Gastro-esophageal reflux disease with esophagitis: Secondary | ICD-10-CM | POA: Diagnosis not present

## 2016-07-21 DIAGNOSIS — Z6836 Body mass index (BMI) 36.0-36.9, adult: Secondary | ICD-10-CM | POA: Diagnosis not present

## 2016-07-30 DIAGNOSIS — M542 Cervicalgia: Secondary | ICD-10-CM | POA: Diagnosis not present

## 2016-07-30 DIAGNOSIS — G5600 Carpal tunnel syndrome, unspecified upper limb: Secondary | ICD-10-CM | POA: Diagnosis not present

## 2016-07-30 DIAGNOSIS — Z79891 Long term (current) use of opiate analgesic: Secondary | ICD-10-CM | POA: Diagnosis not present

## 2016-07-30 DIAGNOSIS — M797 Fibromyalgia: Secondary | ICD-10-CM | POA: Diagnosis not present

## 2016-10-31 DIAGNOSIS — G5600 Carpal tunnel syndrome, unspecified upper limb: Secondary | ICD-10-CM | POA: Diagnosis not present

## 2016-10-31 DIAGNOSIS — Z79891 Long term (current) use of opiate analgesic: Secondary | ICD-10-CM | POA: Diagnosis not present

## 2016-10-31 DIAGNOSIS — M797 Fibromyalgia: Secondary | ICD-10-CM | POA: Diagnosis not present

## 2016-10-31 DIAGNOSIS — M542 Cervicalgia: Secondary | ICD-10-CM | POA: Diagnosis not present

## 2016-11-10 DIAGNOSIS — Z1211 Encounter for screening for malignant neoplasm of colon: Secondary | ICD-10-CM | POA: Diagnosis not present

## 2016-11-11 DIAGNOSIS — K210 Gastro-esophageal reflux disease with esophagitis: Secondary | ICD-10-CM | POA: Diagnosis not present

## 2016-11-11 DIAGNOSIS — I10 Essential (primary) hypertension: Secondary | ICD-10-CM | POA: Diagnosis not present

## 2016-11-11 DIAGNOSIS — N2886 Ureteritis cystica: Secondary | ICD-10-CM | POA: Diagnosis not present

## 2016-11-11 DIAGNOSIS — S3739XA Other injury of urethra, initial encounter: Secondary | ICD-10-CM | POA: Diagnosis not present

## 2016-12-04 DIAGNOSIS — Z8489 Family history of other specified conditions: Secondary | ICD-10-CM | POA: Diagnosis not present

## 2016-12-04 DIAGNOSIS — Z809 Family history of malignant neoplasm, unspecified: Secondary | ICD-10-CM | POA: Diagnosis not present

## 2016-12-04 DIAGNOSIS — Z79899 Other long term (current) drug therapy: Secondary | ICD-10-CM | POA: Diagnosis not present

## 2016-12-04 DIAGNOSIS — Z87891 Personal history of nicotine dependence: Secondary | ICD-10-CM | POA: Diagnosis not present

## 2016-12-04 DIAGNOSIS — I10 Essential (primary) hypertension: Secondary | ICD-10-CM | POA: Diagnosis not present

## 2016-12-04 DIAGNOSIS — Z6834 Body mass index (BMI) 34.0-34.9, adult: Secondary | ICD-10-CM | POA: Diagnosis not present

## 2016-12-04 DIAGNOSIS — E669 Obesity, unspecified: Secondary | ICD-10-CM | POA: Diagnosis not present

## 2016-12-04 DIAGNOSIS — Z1211 Encounter for screening for malignant neoplasm of colon: Secondary | ICD-10-CM | POA: Diagnosis not present

## 2016-12-04 DIAGNOSIS — Z886 Allergy status to analgesic agent status: Secondary | ICD-10-CM | POA: Diagnosis not present

## 2016-12-04 DIAGNOSIS — Z8249 Family history of ischemic heart disease and other diseases of the circulatory system: Secondary | ICD-10-CM | POA: Diagnosis not present

## 2017-01-28 DIAGNOSIS — M542 Cervicalgia: Secondary | ICD-10-CM | POA: Diagnosis not present

## 2017-01-28 DIAGNOSIS — Z79891 Long term (current) use of opiate analgesic: Secondary | ICD-10-CM | POA: Diagnosis not present

## 2017-01-28 DIAGNOSIS — G479 Sleep disorder, unspecified: Secondary | ICD-10-CM | POA: Diagnosis not present

## 2017-01-28 DIAGNOSIS — M797 Fibromyalgia: Secondary | ICD-10-CM | POA: Diagnosis not present

## 2017-02-12 DIAGNOSIS — L708 Other acne: Secondary | ICD-10-CM | POA: Diagnosis not present

## 2017-02-12 DIAGNOSIS — Z6836 Body mass index (BMI) 36.0-36.9, adult: Secondary | ICD-10-CM | POA: Diagnosis not present

## 2017-02-12 DIAGNOSIS — I10 Essential (primary) hypertension: Secondary | ICD-10-CM | POA: Diagnosis not present

## 2017-02-12 DIAGNOSIS — K210 Gastro-esophageal reflux disease with esophagitis: Secondary | ICD-10-CM | POA: Diagnosis not present

## 2017-03-13 DIAGNOSIS — K521 Toxic gastroenteritis and colitis: Secondary | ICD-10-CM | POA: Diagnosis not present

## 2017-03-13 DIAGNOSIS — J0101 Acute recurrent maxillary sinusitis: Secondary | ICD-10-CM | POA: Diagnosis not present

## 2017-03-13 DIAGNOSIS — R1083 Colic: Secondary | ICD-10-CM | POA: Diagnosis not present

## 2017-03-13 DIAGNOSIS — E1165 Type 2 diabetes mellitus with hyperglycemia: Secondary | ICD-10-CM | POA: Diagnosis not present

## 2017-03-13 DIAGNOSIS — Z6836 Body mass index (BMI) 36.0-36.9, adult: Secondary | ICD-10-CM | POA: Diagnosis not present

## 2017-03-13 DIAGNOSIS — K210 Gastro-esophageal reflux disease with esophagitis: Secondary | ICD-10-CM | POA: Diagnosis not present

## 2017-03-13 DIAGNOSIS — I10 Essential (primary) hypertension: Secondary | ICD-10-CM | POA: Diagnosis not present

## 2017-04-28 DIAGNOSIS — Z79891 Long term (current) use of opiate analgesic: Secondary | ICD-10-CM | POA: Diagnosis not present

## 2017-04-28 DIAGNOSIS — M542 Cervicalgia: Secondary | ICD-10-CM | POA: Diagnosis not present

## 2017-04-28 DIAGNOSIS — G479 Sleep disorder, unspecified: Secondary | ICD-10-CM | POA: Diagnosis not present

## 2017-04-28 DIAGNOSIS — M797 Fibromyalgia: Secondary | ICD-10-CM | POA: Diagnosis not present

## 2017-05-01 DIAGNOSIS — M12862 Other specific arthropathies, not elsewhere classified, left knee: Secondary | ICD-10-CM | POA: Diagnosis not present

## 2017-05-01 DIAGNOSIS — Z6837 Body mass index (BMI) 37.0-37.9, adult: Secondary | ICD-10-CM | POA: Diagnosis not present

## 2017-07-29 DIAGNOSIS — G479 Sleep disorder, unspecified: Secondary | ICD-10-CM | POA: Diagnosis not present

## 2017-07-29 DIAGNOSIS — M797 Fibromyalgia: Secondary | ICD-10-CM | POA: Diagnosis not present

## 2017-07-29 DIAGNOSIS — Z79891 Long term (current) use of opiate analgesic: Secondary | ICD-10-CM | POA: Diagnosis not present

## 2017-07-29 DIAGNOSIS — M542 Cervicalgia: Secondary | ICD-10-CM | POA: Diagnosis not present

## 2017-08-03 DIAGNOSIS — K210 Gastro-esophageal reflux disease with esophagitis: Secondary | ICD-10-CM | POA: Diagnosis not present

## 2017-08-03 DIAGNOSIS — Z6836 Body mass index (BMI) 36.0-36.9, adult: Secondary | ICD-10-CM | POA: Diagnosis not present

## 2017-08-03 DIAGNOSIS — I10 Essential (primary) hypertension: Secondary | ICD-10-CM | POA: Diagnosis not present

## 2017-08-03 DIAGNOSIS — L705 Acne excoriee des jeunes filles: Secondary | ICD-10-CM | POA: Diagnosis not present

## 2017-10-19 DIAGNOSIS — G5603 Carpal tunnel syndrome, bilateral upper limbs: Secondary | ICD-10-CM | POA: Diagnosis not present

## 2017-10-19 DIAGNOSIS — M542 Cervicalgia: Secondary | ICD-10-CM | POA: Diagnosis not present

## 2017-10-19 DIAGNOSIS — M545 Low back pain: Secondary | ICD-10-CM | POA: Diagnosis not present

## 2017-10-19 DIAGNOSIS — Z79891 Long term (current) use of opiate analgesic: Secondary | ICD-10-CM | POA: Diagnosis not present

## 2017-11-18 DIAGNOSIS — K210 Gastro-esophageal reflux disease with esophagitis: Secondary | ICD-10-CM | POA: Diagnosis not present

## 2017-11-18 DIAGNOSIS — J301 Allergic rhinitis due to pollen: Secondary | ICD-10-CM | POA: Diagnosis not present

## 2017-11-18 DIAGNOSIS — L705 Acne excoriee des jeunes filles: Secondary | ICD-10-CM | POA: Diagnosis not present

## 2017-11-18 DIAGNOSIS — Z6836 Body mass index (BMI) 36.0-36.9, adult: Secondary | ICD-10-CM | POA: Diagnosis not present

## 2017-11-18 DIAGNOSIS — Z6835 Body mass index (BMI) 35.0-35.9, adult: Secondary | ICD-10-CM | POA: Diagnosis not present

## 2017-11-18 DIAGNOSIS — I10 Essential (primary) hypertension: Secondary | ICD-10-CM | POA: Diagnosis not present

## 2017-12-25 DIAGNOSIS — G5603 Carpal tunnel syndrome, bilateral upper limbs: Secondary | ICD-10-CM | POA: Diagnosis not present

## 2017-12-25 DIAGNOSIS — Z79891 Long term (current) use of opiate analgesic: Secondary | ICD-10-CM | POA: Diagnosis not present

## 2017-12-25 DIAGNOSIS — G4700 Insomnia, unspecified: Secondary | ICD-10-CM | POA: Diagnosis not present

## 2017-12-25 DIAGNOSIS — M545 Low back pain: Secondary | ICD-10-CM | POA: Diagnosis not present

## 2018-03-25 DIAGNOSIS — G5603 Carpal tunnel syndrome, bilateral upper limbs: Secondary | ICD-10-CM | POA: Diagnosis not present

## 2018-03-25 DIAGNOSIS — G4700 Insomnia, unspecified: Secondary | ICD-10-CM | POA: Diagnosis not present

## 2018-03-25 DIAGNOSIS — M545 Low back pain: Secondary | ICD-10-CM | POA: Diagnosis not present

## 2018-03-25 DIAGNOSIS — Z79891 Long term (current) use of opiate analgesic: Secondary | ICD-10-CM | POA: Diagnosis not present

## 2018-04-16 DIAGNOSIS — H6000 Abscess of external ear, unspecified ear: Secondary | ICD-10-CM | POA: Diagnosis not present

## 2018-04-16 DIAGNOSIS — Z6835 Body mass index (BMI) 35.0-35.9, adult: Secondary | ICD-10-CM | POA: Diagnosis not present

## 2018-04-21 DIAGNOSIS — Z0000 Encounter for general adult medical examination without abnormal findings: Secondary | ICD-10-CM | POA: Diagnosis not present

## 2018-04-21 DIAGNOSIS — Z6834 Body mass index (BMI) 34.0-34.9, adult: Secondary | ICD-10-CM | POA: Diagnosis not present

## 2018-06-30 DIAGNOSIS — G5603 Carpal tunnel syndrome, bilateral upper limbs: Secondary | ICD-10-CM | POA: Diagnosis not present

## 2018-06-30 DIAGNOSIS — M545 Low back pain: Secondary | ICD-10-CM | POA: Diagnosis not present

## 2018-06-30 DIAGNOSIS — G4700 Insomnia, unspecified: Secondary | ICD-10-CM | POA: Diagnosis not present

## 2018-06-30 DIAGNOSIS — Z79891 Long term (current) use of opiate analgesic: Secondary | ICD-10-CM | POA: Diagnosis not present

## 2018-08-03 DIAGNOSIS — I10 Essential (primary) hypertension: Secondary | ICD-10-CM | POA: Diagnosis not present

## 2018-08-03 DIAGNOSIS — Z6835 Body mass index (BMI) 35.0-35.9, adult: Secondary | ICD-10-CM | POA: Diagnosis not present

## 2018-08-03 DIAGNOSIS — L03211 Cellulitis of face: Secondary | ICD-10-CM | POA: Diagnosis not present

## 2018-09-22 DIAGNOSIS — G4700 Insomnia, unspecified: Secondary | ICD-10-CM | POA: Diagnosis not present

## 2018-09-22 DIAGNOSIS — G5603 Carpal tunnel syndrome, bilateral upper limbs: Secondary | ICD-10-CM | POA: Diagnosis not present

## 2018-09-22 DIAGNOSIS — Z79891 Long term (current) use of opiate analgesic: Secondary | ICD-10-CM | POA: Diagnosis not present

## 2018-09-22 DIAGNOSIS — M545 Low back pain: Secondary | ICD-10-CM | POA: Diagnosis not present

## 2018-11-03 DIAGNOSIS — J309 Allergic rhinitis, unspecified: Secondary | ICD-10-CM | POA: Diagnosis not present

## 2018-11-03 DIAGNOSIS — Z6835 Body mass index (BMI) 35.0-35.9, adult: Secondary | ICD-10-CM | POA: Diagnosis not present

## 2018-11-03 DIAGNOSIS — L03211 Cellulitis of face: Secondary | ICD-10-CM | POA: Diagnosis not present

## 2018-11-03 DIAGNOSIS — I10 Essential (primary) hypertension: Secondary | ICD-10-CM | POA: Diagnosis not present

## 2018-11-03 DIAGNOSIS — K210 Gastro-esophageal reflux disease with esophagitis: Secondary | ICD-10-CM | POA: Diagnosis not present

## 2018-12-15 DIAGNOSIS — G4700 Insomnia, unspecified: Secondary | ICD-10-CM | POA: Diagnosis not present

## 2018-12-15 DIAGNOSIS — Z79891 Long term (current) use of opiate analgesic: Secondary | ICD-10-CM | POA: Diagnosis not present

## 2018-12-15 DIAGNOSIS — G5603 Carpal tunnel syndrome, bilateral upper limbs: Secondary | ICD-10-CM | POA: Diagnosis not present

## 2018-12-15 DIAGNOSIS — M545 Low back pain: Secondary | ICD-10-CM | POA: Diagnosis not present

## 2019-03-23 DIAGNOSIS — G5603 Carpal tunnel syndrome, bilateral upper limbs: Secondary | ICD-10-CM | POA: Diagnosis not present

## 2019-03-23 DIAGNOSIS — Z79891 Long term (current) use of opiate analgesic: Secondary | ICD-10-CM | POA: Diagnosis not present

## 2019-03-23 DIAGNOSIS — M545 Low back pain: Secondary | ICD-10-CM | POA: Diagnosis not present

## 2019-03-23 DIAGNOSIS — G4700 Insomnia, unspecified: Secondary | ICD-10-CM | POA: Diagnosis not present

## 2019-06-07 DIAGNOSIS — Z125 Encounter for screening for malignant neoplasm of prostate: Secondary | ICD-10-CM | POA: Diagnosis not present

## 2019-06-07 DIAGNOSIS — I10 Essential (primary) hypertension: Secondary | ICD-10-CM | POA: Diagnosis not present

## 2019-06-07 DIAGNOSIS — Z6835 Body mass index (BMI) 35.0-35.9, adult: Secondary | ICD-10-CM | POA: Diagnosis not present

## 2019-06-07 DIAGNOSIS — K210 Gastro-esophageal reflux disease with esophagitis: Secondary | ICD-10-CM | POA: Diagnosis not present

## 2019-06-07 DIAGNOSIS — J301 Allergic rhinitis due to pollen: Secondary | ICD-10-CM | POA: Diagnosis not present

## 2019-06-07 DIAGNOSIS — L702 Acne varioliformis: Secondary | ICD-10-CM | POA: Diagnosis not present

## 2019-06-15 DIAGNOSIS — G4700 Insomnia, unspecified: Secondary | ICD-10-CM | POA: Diagnosis not present

## 2019-06-15 DIAGNOSIS — G5603 Carpal tunnel syndrome, bilateral upper limbs: Secondary | ICD-10-CM | POA: Diagnosis not present

## 2019-06-15 DIAGNOSIS — Z79891 Long term (current) use of opiate analgesic: Secondary | ICD-10-CM | POA: Diagnosis not present

## 2019-06-15 DIAGNOSIS — M545 Low back pain: Secondary | ICD-10-CM | POA: Diagnosis not present

## 2019-09-07 DIAGNOSIS — G5603 Carpal tunnel syndrome, bilateral upper limbs: Secondary | ICD-10-CM | POA: Diagnosis not present

## 2019-09-07 DIAGNOSIS — G4700 Insomnia, unspecified: Secondary | ICD-10-CM | POA: Diagnosis not present

## 2019-09-07 DIAGNOSIS — M545 Low back pain: Secondary | ICD-10-CM | POA: Diagnosis not present

## 2019-09-07 DIAGNOSIS — Z79891 Long term (current) use of opiate analgesic: Secondary | ICD-10-CM | POA: Diagnosis not present

## 2019-09-15 ENCOUNTER — Other Ambulatory Visit: Payer: Self-pay | Admitting: Neurology

## 2019-09-15 ENCOUNTER — Other Ambulatory Visit (HOSPITAL_COMMUNITY): Payer: Self-pay | Admitting: Neurology

## 2019-09-15 DIAGNOSIS — R1902 Left upper quadrant abdominal swelling, mass and lump: Secondary | ICD-10-CM

## 2019-10-05 ENCOUNTER — Ambulatory Visit (HOSPITAL_COMMUNITY)
Admission: RE | Admit: 2019-10-05 | Discharge: 2019-10-05 | Disposition: A | Discharge status: Home / Self Care | Payer: BC Managed Care – PPO | Source: Ambulatory Visit | Attending: Neurology | Admitting: Neurology

## 2019-10-05 ENCOUNTER — Other Ambulatory Visit: Payer: Self-pay

## 2019-10-05 DIAGNOSIS — R1902 Left upper quadrant abdominal swelling, mass and lump: Secondary | ICD-10-CM | POA: Diagnosis not present

## 2019-10-05 DIAGNOSIS — R109 Unspecified abdominal pain: Secondary | ICD-10-CM | POA: Diagnosis not present

## 2019-10-05 LAB — POCT I-STAT CREATININE: Creatinine, Ser: 1.2 mg/dL (ref 0.61–1.24)

## 2019-10-05 MED ORDER — IOHEXOL 300 MG/ML  SOLN
125.0000 mL | Freq: Once | INTRAMUSCULAR | Status: AC | PRN
  Administered 2019-10-05: 125 mL via INTRAVENOUS

## 2019-10-20 DIAGNOSIS — Z6835 Body mass index (BMI) 35.0-35.9, adult: Secondary | ICD-10-CM | POA: Diagnosis not present

## 2019-10-20 DIAGNOSIS — Z0000 Encounter for general adult medical examination without abnormal findings: Secondary | ICD-10-CM | POA: Diagnosis not present

## 2019-11-30 DIAGNOSIS — G4700 Insomnia, unspecified: Secondary | ICD-10-CM | POA: Diagnosis not present

## 2019-11-30 DIAGNOSIS — G5603 Carpal tunnel syndrome, bilateral upper limbs: Secondary | ICD-10-CM | POA: Diagnosis not present

## 2019-11-30 DIAGNOSIS — R1902 Left upper quadrant abdominal swelling, mass and lump: Secondary | ICD-10-CM | POA: Diagnosis not present

## 2019-11-30 DIAGNOSIS — G4733 Obstructive sleep apnea (adult) (pediatric): Secondary | ICD-10-CM | POA: Diagnosis not present

## 2019-12-13 ENCOUNTER — Ambulatory Visit: Payer: BC Managed Care – PPO | Admitting: Urology

## 2020-01-13 IMAGING — CT CT ABD-PELV W/ CM
2 of 5 series · 16 of 46 positions shown, 18 images · IV contrast (Omnipaque or Isovue)
Comparison: CT abdomen pelvis dated 10/06/2011.

CLINICAL DATA: 53-year-old male with upper abdominal pain.

EXAM:
CT ABDOMEN AND PELVIS WITH CONTRAST
TECHNIQUE: Multidetector CT imaging of the abdomen and pelvis was performed
using the standard protocol following bolus administration of
intravenous contrast.
CONTRAST:  125mL OMNIPAQUE IOHEXOL 300 MG/ML  SOLN

[Series 2: axial st · axial · 0.89mm/px · z∈[-441,-31]mm · 13 of 96 slices shown, 15 images]
[im 7/96  soft-tissue]
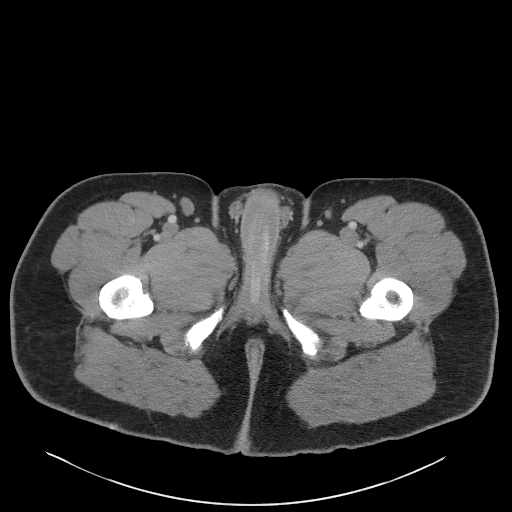
[im 7/96  bone]
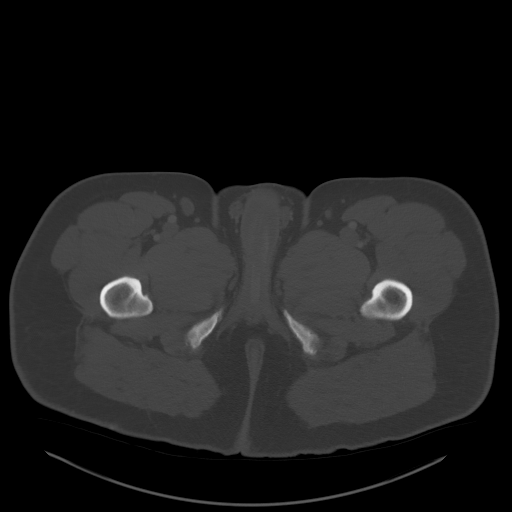
[im 13/96  soft-tissue]
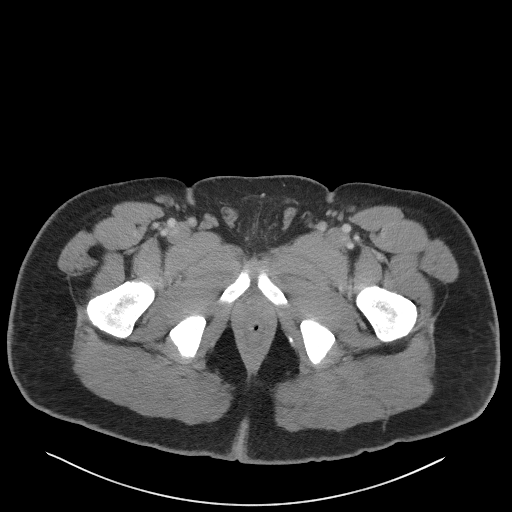
[im 20/96  soft-tissue]
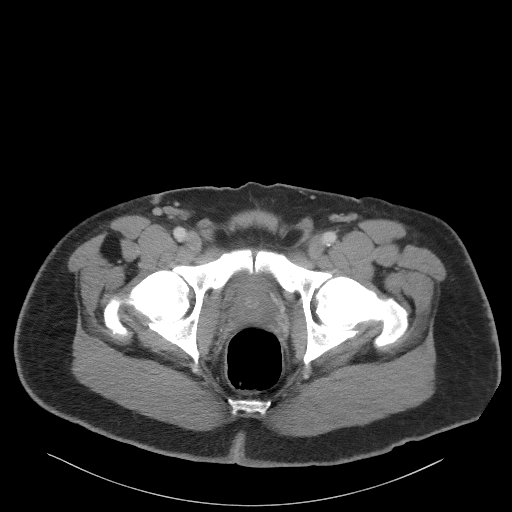
[im 26/96  soft-tissue]
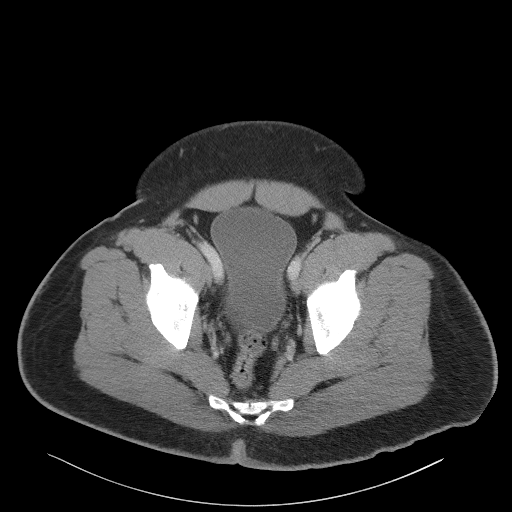
[im 32/96  soft-tissue]
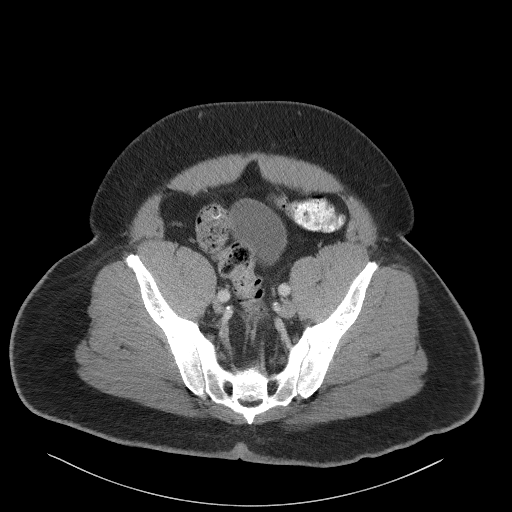
[im 39/96  soft-tissue]
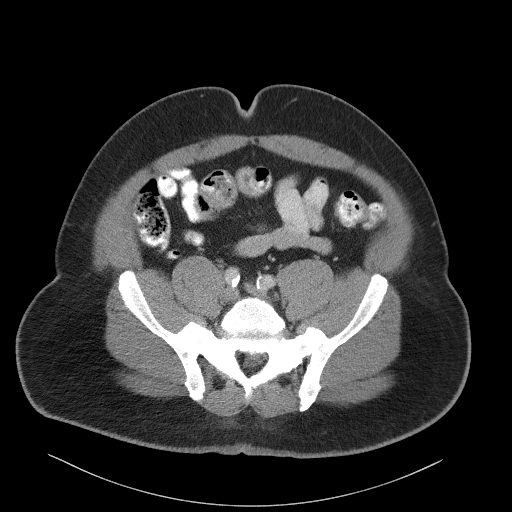
[im 51/96  soft-tissue]
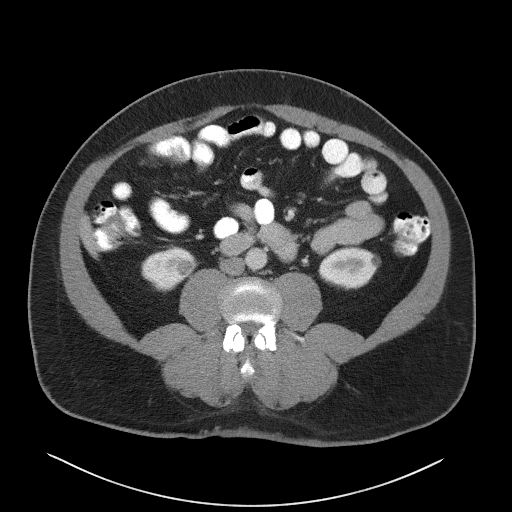
[im 58/96  soft-tissue]
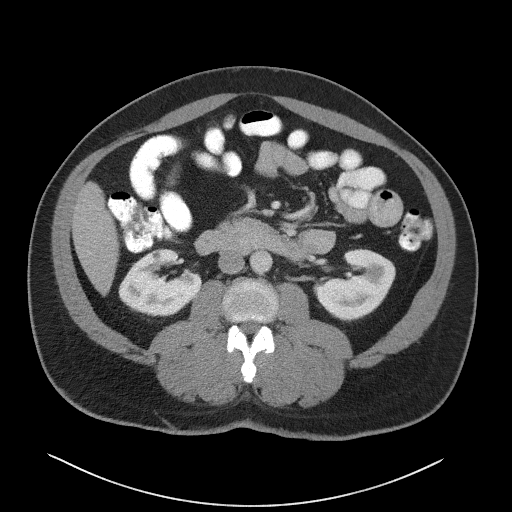
[im 64/96  soft-tissue]
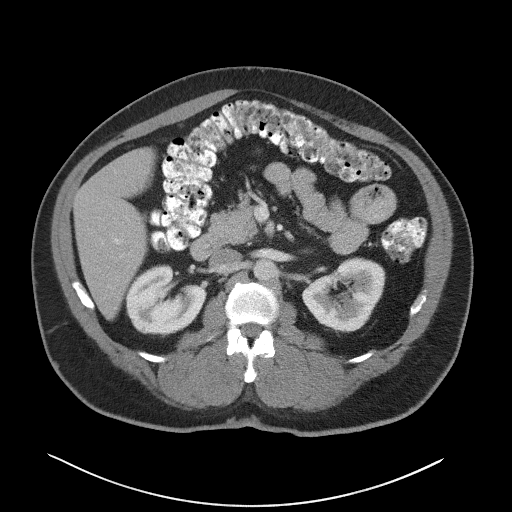
[im 64/96  bone]
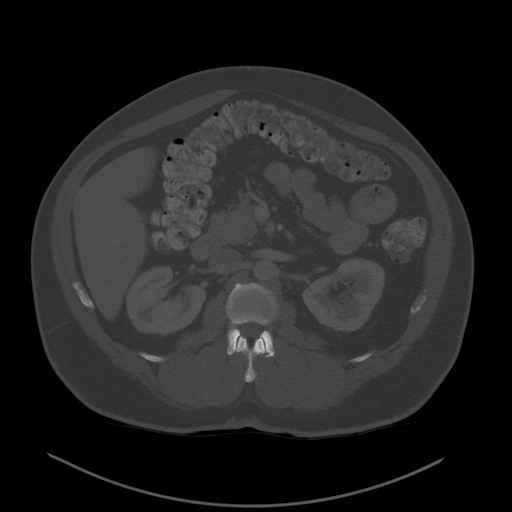
[im 70/96  soft-tissue]
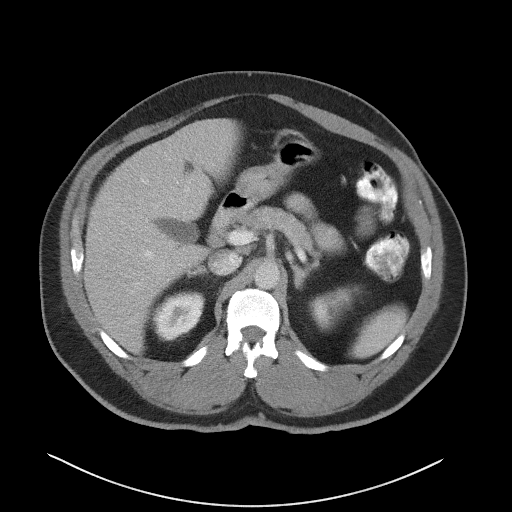
[im 77/96  soft-tissue]
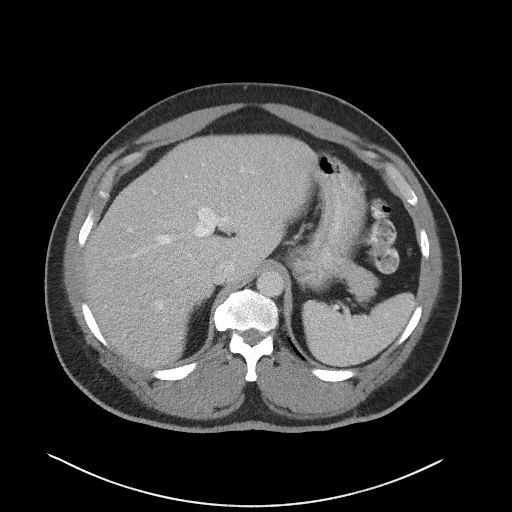
[im 83/96  soft-tissue]
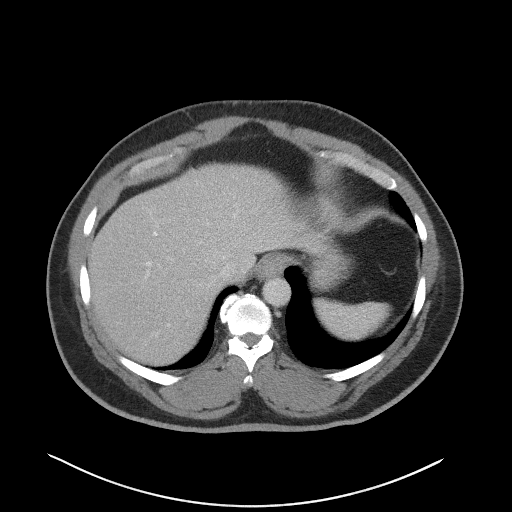
[im 89/96  soft-tissue]
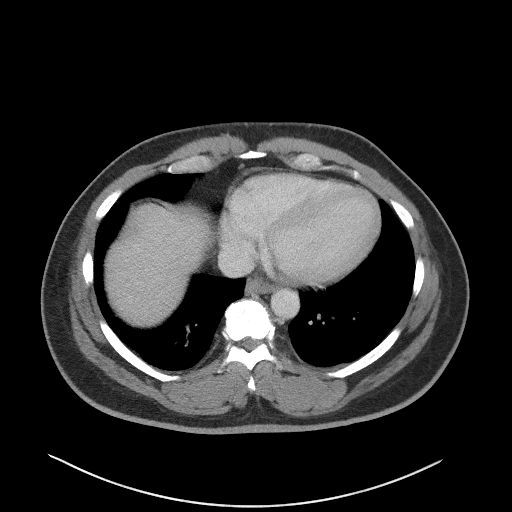

[Series 5: coronal st · coronal · 0.83mm/px · 3 of 120 slices shown]
[im 40/120  soft-tissue]
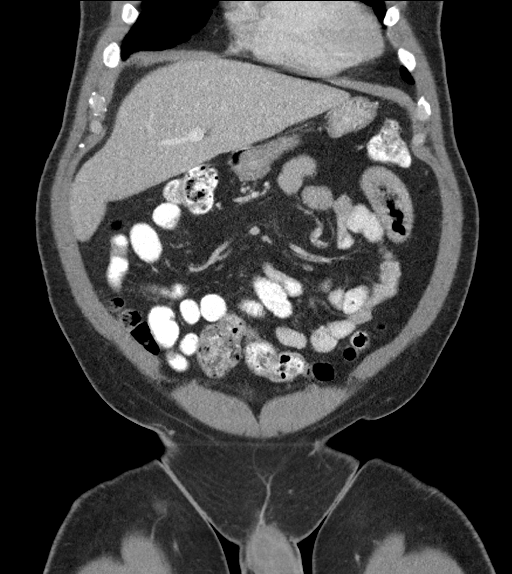
[im 53/120  soft-tissue]
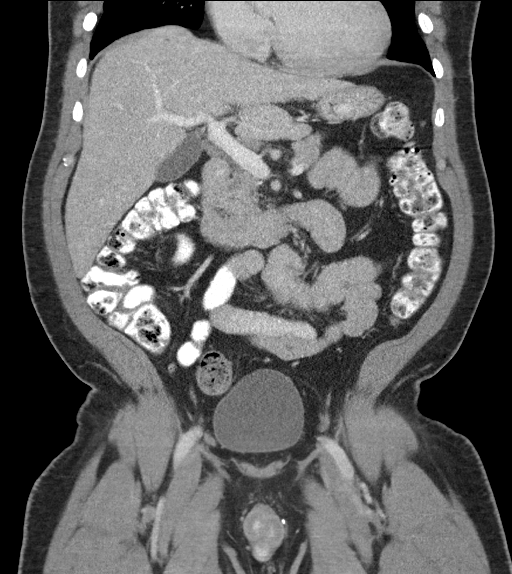
[im 67/120  soft-tissue]
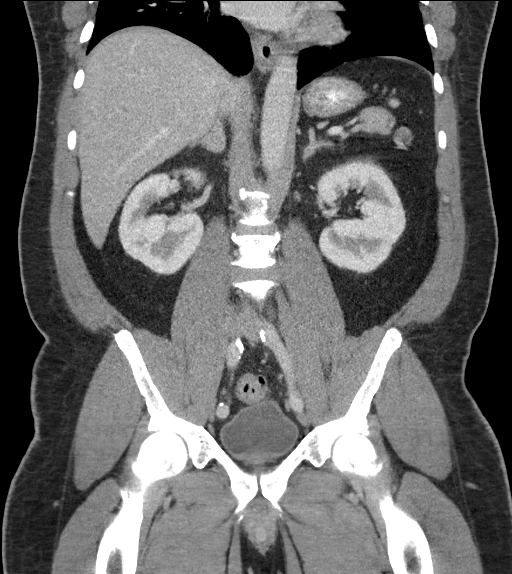

[16 of 46 positions shown; findings below may reference images not displayed]

FINDINGS: Lower chest: The visualized lung bases are clear.

No intra-abdominal free air or free fluid.

Hepatobiliary: No focal liver abnormality is seen. No gallstones,
gallbladder wall thickening, or biliary dilatation.

Pancreas: Unremarkable. No pancreatic ductal dilatation or
surrounding inflammatory changes.

Spleen: Normal in size without focal abnormality.

Adrenals/Urinary Tract: Subcentimeter left adrenal fat containing
nodule, likely a small myelolipoma or adenoma. Indeterminate 11 mm
focal thickening or nodularity of the right adrenal gland. There is
no hydronephrosis on either side. There is symmetric enhancement and
excretion of contrast by both kidneys. Several bilateral small renal
hypodense lesions are suboptimally characterized. The visualized
ureters and urinary bladder appear unremarkable.

Stomach/Bowel: There is no bowel obstruction or active inflammation.
The appendix is normal.

Vascular/Lymphatic: Mild aortoiliac atherosclerotic disease. The IVC
is unremarkable. No portal venous gas. There is no adenopathy.

Reproductive: The prostate and seminal vesicle are grossly
unremarkable. No pelvic mass.

Other: Small fat containing umbilical hernia.

Musculoskeletal: Mild degenerative changes of the spine. No acute
osseous pathology.
IMPRESSION: No acute intra-abdominal or pelvic pathology. No bowel obstruction
or active inflammation. Normal appendix.

## 2020-01-25 DIAGNOSIS — G4733 Obstructive sleep apnea (adult) (pediatric): Secondary | ICD-10-CM | POA: Diagnosis not present

## 2020-01-25 DIAGNOSIS — M545 Low back pain: Secondary | ICD-10-CM | POA: Diagnosis not present

## 2020-01-25 DIAGNOSIS — G4700 Insomnia, unspecified: Secondary | ICD-10-CM | POA: Diagnosis not present

## 2020-01-25 DIAGNOSIS — Z79891 Long term (current) use of opiate analgesic: Secondary | ICD-10-CM | POA: Diagnosis not present

## 2020-01-30 DIAGNOSIS — J309 Allergic rhinitis, unspecified: Secondary | ICD-10-CM | POA: Diagnosis not present

## 2020-01-30 DIAGNOSIS — L709 Acne, unspecified: Secondary | ICD-10-CM | POA: Diagnosis not present

## 2020-01-30 DIAGNOSIS — I10 Essential (primary) hypertension: Secondary | ICD-10-CM | POA: Diagnosis not present

## 2020-01-30 DIAGNOSIS — K219 Gastro-esophageal reflux disease without esophagitis: Secondary | ICD-10-CM | POA: Diagnosis not present

## 2020-02-01 ENCOUNTER — Ambulatory Visit: Payer: BC Managed Care – PPO | Admitting: Urology

## 2020-03-26 ENCOUNTER — Ambulatory Visit: Payer: Self-pay | Admitting: Urology

## 2020-04-02 ENCOUNTER — Ambulatory Visit: Payer: Self-pay | Admitting: Urology

## 2020-04-08 DIAGNOSIS — G4730 Sleep apnea, unspecified: Secondary | ICD-10-CM | POA: Diagnosis not present

## 2020-04-18 DIAGNOSIS — M542 Cervicalgia: Secondary | ICD-10-CM | POA: Diagnosis not present

## 2020-04-18 DIAGNOSIS — Z79891 Long term (current) use of opiate analgesic: Secondary | ICD-10-CM | POA: Diagnosis not present

## 2020-04-18 DIAGNOSIS — M545 Low back pain: Secondary | ICD-10-CM | POA: Diagnosis not present

## 2020-04-18 DIAGNOSIS — G4700 Insomnia, unspecified: Secondary | ICD-10-CM | POA: Diagnosis not present

## 2020-04-19 DIAGNOSIS — Z23 Encounter for immunization: Secondary | ICD-10-CM | POA: Diagnosis not present

## 2020-05-30 DIAGNOSIS — I10 Essential (primary) hypertension: Secondary | ICD-10-CM | POA: Diagnosis not present

## 2020-05-30 DIAGNOSIS — L709 Acne, unspecified: Secondary | ICD-10-CM | POA: Diagnosis not present

## 2020-05-30 DIAGNOSIS — J309 Allergic rhinitis, unspecified: Secondary | ICD-10-CM | POA: Diagnosis not present

## 2020-05-30 DIAGNOSIS — K219 Gastro-esophageal reflux disease without esophagitis: Secondary | ICD-10-CM | POA: Diagnosis not present

## 2020-06-19 DIAGNOSIS — G4733 Obstructive sleep apnea (adult) (pediatric): Secondary | ICD-10-CM | POA: Diagnosis not present

## 2020-07-16 DIAGNOSIS — M545 Low back pain, unspecified: Secondary | ICD-10-CM | POA: Diagnosis not present

## 2020-07-16 DIAGNOSIS — G4700 Insomnia, unspecified: Secondary | ICD-10-CM | POA: Diagnosis not present

## 2020-07-16 DIAGNOSIS — Z79891 Long term (current) use of opiate analgesic: Secondary | ICD-10-CM | POA: Diagnosis not present

## 2020-07-16 DIAGNOSIS — M542 Cervicalgia: Secondary | ICD-10-CM | POA: Diagnosis not present

## 2020-07-19 DIAGNOSIS — G4733 Obstructive sleep apnea (adult) (pediatric): Secondary | ICD-10-CM | POA: Diagnosis not present

## 2020-08-19 DIAGNOSIS — G4733 Obstructive sleep apnea (adult) (pediatric): Secondary | ICD-10-CM | POA: Diagnosis not present

## 2020-08-28 DIAGNOSIS — I10 Essential (primary) hypertension: Secondary | ICD-10-CM | POA: Diagnosis not present

## 2020-09-18 DIAGNOSIS — G4733 Obstructive sleep apnea (adult) (pediatric): Secondary | ICD-10-CM | POA: Diagnosis not present

## 2020-09-19 DIAGNOSIS — S338XXA Sprain of other parts of lumbar spine and pelvis, initial encounter: Secondary | ICD-10-CM | POA: Diagnosis not present

## 2020-09-19 DIAGNOSIS — S134XXA Sprain of ligaments of cervical spine, initial encounter: Secondary | ICD-10-CM | POA: Diagnosis not present

## 2020-09-24 DIAGNOSIS — S134XXA Sprain of ligaments of cervical spine, initial encounter: Secondary | ICD-10-CM | POA: Diagnosis not present

## 2020-09-24 DIAGNOSIS — S338XXA Sprain of other parts of lumbar spine and pelvis, initial encounter: Secondary | ICD-10-CM | POA: Diagnosis not present

## 2020-10-08 DIAGNOSIS — G4700 Insomnia, unspecified: Secondary | ICD-10-CM | POA: Diagnosis not present

## 2020-10-08 DIAGNOSIS — M542 Cervicalgia: Secondary | ICD-10-CM | POA: Diagnosis not present

## 2020-10-08 DIAGNOSIS — Z79891 Long term (current) use of opiate analgesic: Secondary | ICD-10-CM | POA: Diagnosis not present

## 2020-10-08 DIAGNOSIS — M545 Low back pain, unspecified: Secondary | ICD-10-CM | POA: Diagnosis not present

## 2020-10-08 DIAGNOSIS — I10 Essential (primary) hypertension: Secondary | ICD-10-CM | POA: Diagnosis not present

## 2020-10-19 DIAGNOSIS — G4733 Obstructive sleep apnea (adult) (pediatric): Secondary | ICD-10-CM | POA: Diagnosis not present

## 2020-11-06 DIAGNOSIS — S338XXA Sprain of other parts of lumbar spine and pelvis, initial encounter: Secondary | ICD-10-CM | POA: Diagnosis not present

## 2020-11-06 DIAGNOSIS — S134XXA Sprain of ligaments of cervical spine, initial encounter: Secondary | ICD-10-CM | POA: Diagnosis not present

## 2020-11-19 DIAGNOSIS — G4733 Obstructive sleep apnea (adult) (pediatric): Secondary | ICD-10-CM | POA: Diagnosis not present

## 2020-11-20 DIAGNOSIS — S134XXA Sprain of ligaments of cervical spine, initial encounter: Secondary | ICD-10-CM | POA: Diagnosis not present

## 2020-11-20 DIAGNOSIS — S338XXA Sprain of other parts of lumbar spine and pelvis, initial encounter: Secondary | ICD-10-CM | POA: Diagnosis not present

## 2020-12-03 DIAGNOSIS — G4700 Insomnia, unspecified: Secondary | ICD-10-CM | POA: Diagnosis not present

## 2020-12-03 DIAGNOSIS — M545 Low back pain, unspecified: Secondary | ICD-10-CM | POA: Diagnosis not present

## 2020-12-03 DIAGNOSIS — M542 Cervicalgia: Secondary | ICD-10-CM | POA: Diagnosis not present

## 2020-12-03 DIAGNOSIS — Z79891 Long term (current) use of opiate analgesic: Secondary | ICD-10-CM | POA: Diagnosis not present

## 2020-12-03 DIAGNOSIS — Z6835 Body mass index (BMI) 35.0-35.9, adult: Secondary | ICD-10-CM | POA: Diagnosis not present

## 2020-12-03 DIAGNOSIS — Z0000 Encounter for general adult medical examination without abnormal findings: Secondary | ICD-10-CM | POA: Diagnosis not present

## 2020-12-04 DIAGNOSIS — S338XXA Sprain of other parts of lumbar spine and pelvis, initial encounter: Secondary | ICD-10-CM | POA: Diagnosis not present

## 2020-12-04 DIAGNOSIS — S134XXA Sprain of ligaments of cervical spine, initial encounter: Secondary | ICD-10-CM | POA: Diagnosis not present

## 2020-12-17 DIAGNOSIS — G4733 Obstructive sleep apnea (adult) (pediatric): Secondary | ICD-10-CM | POA: Diagnosis not present

## 2020-12-18 DIAGNOSIS — S338XXA Sprain of other parts of lumbar spine and pelvis, initial encounter: Secondary | ICD-10-CM | POA: Diagnosis not present

## 2020-12-18 DIAGNOSIS — S134XXA Sprain of ligaments of cervical spine, initial encounter: Secondary | ICD-10-CM | POA: Diagnosis not present

## 2020-12-27 DIAGNOSIS — S338XXA Sprain of other parts of lumbar spine and pelvis, initial encounter: Secondary | ICD-10-CM | POA: Diagnosis not present

## 2020-12-27 DIAGNOSIS — S134XXA Sprain of ligaments of cervical spine, initial encounter: Secondary | ICD-10-CM | POA: Diagnosis not present

## 2021-01-17 DIAGNOSIS — G4733 Obstructive sleep apnea (adult) (pediatric): Secondary | ICD-10-CM | POA: Diagnosis not present

## 2021-02-01 DIAGNOSIS — G4733 Obstructive sleep apnea (adult) (pediatric): Secondary | ICD-10-CM | POA: Diagnosis not present

## 2021-02-11 DIAGNOSIS — S134XXA Sprain of ligaments of cervical spine, initial encounter: Secondary | ICD-10-CM | POA: Diagnosis not present

## 2021-02-11 DIAGNOSIS — S338XXA Sprain of other parts of lumbar spine and pelvis, initial encounter: Secondary | ICD-10-CM | POA: Diagnosis not present

## 2021-02-20 DIAGNOSIS — S134XXA Sprain of ligaments of cervical spine, initial encounter: Secondary | ICD-10-CM | POA: Diagnosis not present

## 2021-02-20 DIAGNOSIS — S338XXA Sprain of other parts of lumbar spine and pelvis, initial encounter: Secondary | ICD-10-CM | POA: Diagnosis not present

## 2021-02-25 DIAGNOSIS — Z79891 Long term (current) use of opiate analgesic: Secondary | ICD-10-CM | POA: Diagnosis not present

## 2021-02-25 DIAGNOSIS — G4700 Insomnia, unspecified: Secondary | ICD-10-CM | POA: Diagnosis not present

## 2021-02-25 DIAGNOSIS — M545 Low back pain, unspecified: Secondary | ICD-10-CM | POA: Diagnosis not present

## 2021-02-25 DIAGNOSIS — M542 Cervicalgia: Secondary | ICD-10-CM | POA: Diagnosis not present

## 2021-04-12 DIAGNOSIS — S134XXA Sprain of ligaments of cervical spine, initial encounter: Secondary | ICD-10-CM | POA: Diagnosis not present

## 2021-04-12 DIAGNOSIS — S338XXA Sprain of other parts of lumbar spine and pelvis, initial encounter: Secondary | ICD-10-CM | POA: Diagnosis not present

## 2021-05-11 DIAGNOSIS — G4733 Obstructive sleep apnea (adult) (pediatric): Secondary | ICD-10-CM | POA: Diagnosis not present

## 2021-05-21 ENCOUNTER — Other Ambulatory Visit (HOSPITAL_COMMUNITY): Payer: Self-pay | Admitting: Neurology

## 2021-05-21 DIAGNOSIS — M546 Pain in thoracic spine: Secondary | ICD-10-CM

## 2021-05-21 DIAGNOSIS — M542 Cervicalgia: Secondary | ICD-10-CM | POA: Diagnosis not present

## 2021-05-21 DIAGNOSIS — Z79891 Long term (current) use of opiate analgesic: Secondary | ICD-10-CM | POA: Diagnosis not present

## 2021-05-21 DIAGNOSIS — G4733 Obstructive sleep apnea (adult) (pediatric): Secondary | ICD-10-CM | POA: Diagnosis not present

## 2021-05-21 DIAGNOSIS — G4700 Insomnia, unspecified: Secondary | ICD-10-CM | POA: Diagnosis not present

## 2021-05-21 DIAGNOSIS — M545 Low back pain, unspecified: Secondary | ICD-10-CM

## 2021-06-03 DIAGNOSIS — S338XXA Sprain of other parts of lumbar spine and pelvis, initial encounter: Secondary | ICD-10-CM | POA: Diagnosis not present

## 2021-06-03 DIAGNOSIS — S134XXA Sprain of ligaments of cervical spine, initial encounter: Secondary | ICD-10-CM | POA: Diagnosis not present

## 2021-06-13 DIAGNOSIS — I10 Essential (primary) hypertension: Secondary | ICD-10-CM | POA: Diagnosis not present

## 2021-08-22 DIAGNOSIS — G4700 Insomnia, unspecified: Secondary | ICD-10-CM | POA: Diagnosis not present

## 2021-08-22 DIAGNOSIS — M542 Cervicalgia: Secondary | ICD-10-CM | POA: Diagnosis not present

## 2021-08-22 DIAGNOSIS — Z79891 Long term (current) use of opiate analgesic: Secondary | ICD-10-CM | POA: Diagnosis not present

## 2021-08-22 DIAGNOSIS — M545 Low back pain, unspecified: Secondary | ICD-10-CM | POA: Diagnosis not present

## 2021-09-12 DIAGNOSIS — I10 Essential (primary) hypertension: Secondary | ICD-10-CM | POA: Diagnosis not present

## 2021-11-13 DIAGNOSIS — S338XXA Sprain of other parts of lumbar spine and pelvis, initial encounter: Secondary | ICD-10-CM | POA: Diagnosis not present

## 2021-11-13 DIAGNOSIS — S134XXA Sprain of ligaments of cervical spine, initial encounter: Secondary | ICD-10-CM | POA: Diagnosis not present

## 2021-11-14 DIAGNOSIS — M545 Low back pain, unspecified: Secondary | ICD-10-CM | POA: Diagnosis not present

## 2021-11-14 DIAGNOSIS — M542 Cervicalgia: Secondary | ICD-10-CM | POA: Diagnosis not present

## 2021-11-14 DIAGNOSIS — Z79891 Long term (current) use of opiate analgesic: Secondary | ICD-10-CM | POA: Diagnosis not present

## 2021-11-19 DIAGNOSIS — S134XXA Sprain of ligaments of cervical spine, initial encounter: Secondary | ICD-10-CM | POA: Diagnosis not present

## 2021-11-19 DIAGNOSIS — S338XXA Sprain of other parts of lumbar spine and pelvis, initial encounter: Secondary | ICD-10-CM | POA: Diagnosis not present

## 2021-11-24 DIAGNOSIS — Z20822 Contact with and (suspected) exposure to covid-19: Secondary | ICD-10-CM | POA: Diagnosis not present

## 2021-11-24 DIAGNOSIS — J209 Acute bronchitis, unspecified: Secondary | ICD-10-CM | POA: Diagnosis not present

## 2021-12-05 DIAGNOSIS — R0603 Acute respiratory distress: Secondary | ICD-10-CM | POA: Diagnosis not present

## 2021-12-05 DIAGNOSIS — J329 Chronic sinusitis, unspecified: Secondary | ICD-10-CM | POA: Diagnosis not present

## 2021-12-05 DIAGNOSIS — R051 Acute cough: Secondary | ICD-10-CM | POA: Diagnosis not present

## 2021-12-05 DIAGNOSIS — R0602 Shortness of breath: Secondary | ICD-10-CM | POA: Diagnosis not present

## 2021-12-19 DIAGNOSIS — R519 Headache, unspecified: Secondary | ICD-10-CM | POA: Diagnosis not present

## 2021-12-19 DIAGNOSIS — I160 Hypertensive urgency: Secondary | ICD-10-CM | POA: Diagnosis not present

## 2021-12-19 DIAGNOSIS — R609 Edema, unspecified: Secondary | ICD-10-CM | POA: Diagnosis not present

## 2021-12-30 DIAGNOSIS — I10 Essential (primary) hypertension: Secondary | ICD-10-CM | POA: Diagnosis not present

## 2021-12-30 DIAGNOSIS — J309 Allergic rhinitis, unspecified: Secondary | ICD-10-CM | POA: Diagnosis not present

## 2021-12-30 DIAGNOSIS — L709 Acne, unspecified: Secondary | ICD-10-CM | POA: Diagnosis not present

## 2021-12-30 DIAGNOSIS — K219 Gastro-esophageal reflux disease without esophagitis: Secondary | ICD-10-CM | POA: Diagnosis not present

## 2022-01-08 DIAGNOSIS — G4700 Insomnia, unspecified: Secondary | ICD-10-CM | POA: Diagnosis not present

## 2022-01-08 DIAGNOSIS — M542 Cervicalgia: Secondary | ICD-10-CM | POA: Diagnosis not present

## 2022-01-08 DIAGNOSIS — M545 Low back pain, unspecified: Secondary | ICD-10-CM | POA: Diagnosis not present

## 2022-01-08 DIAGNOSIS — Z79891 Long term (current) use of opiate analgesic: Secondary | ICD-10-CM | POA: Diagnosis not present

## 2022-01-08 DIAGNOSIS — G4733 Obstructive sleep apnea (adult) (pediatric): Secondary | ICD-10-CM | POA: Diagnosis not present

## 2022-01-10 DIAGNOSIS — K7460 Unspecified cirrhosis of liver: Secondary | ICD-10-CM | POA: Diagnosis not present

## 2022-01-10 DIAGNOSIS — I272 Pulmonary hypertension, unspecified: Secondary | ICD-10-CM | POA: Diagnosis not present

## 2022-01-10 DIAGNOSIS — I5043 Acute on chronic combined systolic (congestive) and diastolic (congestive) heart failure: Secondary | ICD-10-CM | POA: Diagnosis not present

## 2022-01-10 DIAGNOSIS — R0602 Shortness of breath: Secondary | ICD-10-CM | POA: Diagnosis not present

## 2022-01-10 DIAGNOSIS — Z79891 Long term (current) use of opiate analgesic: Secondary | ICD-10-CM | POA: Diagnosis not present

## 2022-01-10 DIAGNOSIS — Z79899 Other long term (current) drug therapy: Secondary | ICD-10-CM | POA: Diagnosis not present

## 2022-01-10 DIAGNOSIS — N289 Disorder of kidney and ureter, unspecified: Secondary | ICD-10-CM | POA: Diagnosis not present

## 2022-01-10 DIAGNOSIS — I5021 Acute systolic (congestive) heart failure: Secondary | ICD-10-CM | POA: Diagnosis not present

## 2022-01-10 DIAGNOSIS — I509 Heart failure, unspecified: Secondary | ICD-10-CM | POA: Diagnosis not present

## 2022-01-10 DIAGNOSIS — R1011 Right upper quadrant pain: Secondary | ICD-10-CM | POA: Diagnosis not present

## 2022-01-10 DIAGNOSIS — B1920 Unspecified viral hepatitis C without hepatic coma: Secondary | ICD-10-CM | POA: Diagnosis not present

## 2022-01-10 DIAGNOSIS — Z87891 Personal history of nicotine dependence: Secondary | ICD-10-CM | POA: Diagnosis not present

## 2022-01-10 DIAGNOSIS — Z20822 Contact with and (suspected) exposure to covid-19: Secondary | ICD-10-CM | POA: Diagnosis not present

## 2022-01-10 DIAGNOSIS — I5023 Acute on chronic systolic (congestive) heart failure: Secondary | ICD-10-CM | POA: Diagnosis not present

## 2022-01-10 DIAGNOSIS — Z6832 Body mass index (BMI) 32.0-32.9, adult: Secondary | ICD-10-CM | POA: Diagnosis not present

## 2022-01-10 DIAGNOSIS — G4733 Obstructive sleep apnea (adult) (pediatric): Secondary | ICD-10-CM | POA: Diagnosis not present

## 2022-01-10 DIAGNOSIS — E876 Hypokalemia: Secondary | ICD-10-CM | POA: Diagnosis not present

## 2022-01-10 DIAGNOSIS — I110 Hypertensive heart disease with heart failure: Secondary | ICD-10-CM | POA: Diagnosis not present

## 2022-01-10 DIAGNOSIS — E669 Obesity, unspecified: Secondary | ICD-10-CM | POA: Diagnosis not present

## 2022-01-10 DIAGNOSIS — Z885 Allergy status to narcotic agent status: Secondary | ICD-10-CM | POA: Diagnosis not present

## 2022-01-10 DIAGNOSIS — D72829 Elevated white blood cell count, unspecified: Secondary | ICD-10-CM | POA: Diagnosis not present

## 2022-01-16 ENCOUNTER — Ambulatory Visit (INDEPENDENT_AMBULATORY_CARE_PROVIDER_SITE_OTHER): Payer: Self-pay | Admitting: Gastroenterology

## 2022-01-16 ENCOUNTER — Encounter (INDEPENDENT_AMBULATORY_CARE_PROVIDER_SITE_OTHER): Payer: Self-pay | Admitting: Gastroenterology

## 2022-01-16 DIAGNOSIS — R768 Other specified abnormal immunological findings in serum: Secondary | ICD-10-CM | POA: Insufficient documentation

## 2022-01-16 DIAGNOSIS — R932 Abnormal findings on diagnostic imaging of liver and biliary tract: Secondary | ICD-10-CM

## 2022-01-16 NOTE — Progress Notes (Signed)
David Morse, M.D. ?Gastroenterology & Hepatology ?[facility] ?[address] ?[place], [place] [address] ?Primary Care Physician: ?Patient, No Pcp Per (Inactive) ?No address on file ? ?Referring MD: None ? ?Chief Complaint: Hepatitis C and possible cirrhosis ? ?History of Present Illness: ?David Morse is a 56 y.o. 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 ? ?Patient was admitted to [facility] on 01/10/2022 after presenting with worsening shortness of breath and episodes of chest tightness, along with edema in lower extremities. Patient was found to have acute acute systolic and diastolic heart failure. During his hospitalization, he had a few runs of V. tach.  He was given management with diuretics and was seen by cardiology.  Notably, as part of evaluation during this hospitalization, he was found to have nodular control of liver on most recent right upper quadrant abdominal ultrasound on 01/10/2022.  Also had positive hepatitis C antibody testing.Due to this he was referred to our office. He was referred to see dr. Sharrell Ku for cardiology follow up tomorrow. ? ?Most recent blood work-up from 01/13/2022 showed a CBC with white blood cell count of 10.1, hemoglobin 15.2, platelets 323,CMP showed a sodium of 140, potassium 3.5, creatinine 1.77, albumin 2.8, total bilirubin 0.5, AST 22, ALT 26, alkaline phosphatase 83, INR 1.2. ? ?He did not know about having hepatitis C diagnosis. He does not know If any of his sexual partners had hepatitis C. Did cocaine in the 1990s. Never had transfusions or tattoos. ? ?States that prior to his admission to the hospital he was having significant abdominal bloating which has improved after he was put on furosemide  40 mg, cardvedilol and Entresto. The patient denies  having any nausea, vomiting, fever, chills, hematochezia, melena, hematemesis, abdominal distention, abdominal pain, diarrhea, jaundice, pruritus . Has lost weight with diuresis - lost close to 25 lb due to this. ? ?Last CHY:IFOYD ?Last Colonoscopy: believes at age 30, normal per patient but no report available. Believes he was told to repeat in 10 years, ? ?FHx: neg for any gastrointestinal/liver disease, no malignancies ?Social: former  smoking quit ion 2013, neg alcohol. Did inhaled cocaine in 1996. ?Surgical: no abdominal surgeries ? ?Past Medical History: ?Past Medical History:  ?Diagnosis Date  ? Hypertension   ? Pneumonia   ? ? ?Past Surgical History: ?Past Surgical History:  ?Procedure Laterality Date  ? ANTERIOR CERVICAL DECOMP/DISCECTOMY FUSION  11/17/2011  ? Procedure: ANTERIOR CERVICAL DECOMPRESSION/DISCECTOMY FUSION 1 LEVEL;  Surgeon: Cristi Loron, MD;  Location: [facility];  Service: Neurosurgery;  Laterality: N/A;  Cervical three-four anterior cervical decompression/discectomy fusion with plating  ? ANTERIOR CERVICAL DECOMP/DISCECTOMY FUSION  11/20/2011  ? Procedure: ANTERIOR CERVICAL DECOMPRESSION/DISCECTOMY FUSION 1 LEVEL;  Surgeon: Stefani Dama, MD;  Location: [facility];  Service: Neurosurgery;  Laterality: N/A;  evacuation of cervical hematoma  ? CIRCUMCISION    ? [facility] [place]  ? ? ?Family History: ?Family History  ?Problem Relation Age of Onset  ? Lupus Mother   ? ? ?Social History: ?Social History  ? ?Tobacco Use  ?Smoking Status Former  ? Packs/day: 1.00  ? Types: Cigarettes  ? Quit date: 2013  ? Years since quitting: 10.2  ?Smokeless Tobacco Not on file  ? ?Social History  ? ?Substance and Sexual Activity  ?Alcohol Use No  ? ?Social History  ? ?  Substance and Sexual Activity  ?Drug Use Not Currently  ? Types: Marijuana, "Crack" cocaine, Cocaine  ? ? ?Allergies: ?Allergies  ?Allergen Reactions  ? Morphine And Related Nausea And Vomiting  ? ? ?Medications: ?Current Outpatient  Medications  ?Medication Sig Dispense Refill  ? carvedilol (COREG) 6.25 MG tablet Take by mouth.    ? sacubitril-valsartan (ENTRESTO) 24-26 MG Take by mouth.    ? ?No current facility-administered medications for this visit.  ? ? ?Review of Systems: ?GENERAL: negative for malaise, night sweats ?HEENT: No changes in hearing or vision, no nose bleeds or other nasal problems. ?NECK: Negative for lumps, goiter, pain and significant neck swelling ?RESPIRATORY: Negative for cough, wheezing ?CARDIOVASCULAR: Negative for chest pain, leg swelling, palpitations, orthopnea ?GI: SEE HPI ?MUSCULOSKELETAL: Negative for joint pain or swelling, back pain, and muscle pain. ?SKIN: Negative for lesions, rash ?PSYCH: Negative for sleep disturbance, mood disorder and recent psychosocial stressors. ?HEMATOLOGY Negative for prolonged bleeding, bruising easily, and swollen nodes. ?ENDOCRINE: Negative for cold or heat intolerance, polyuria, polydipsia and goiter. ?NEURO: negative for tremor, gait imbalance, syncope and seizures. ?The remainder of the review of systems is noncontributory. ? ? ?Physical Exam: ?BP (!) 153/104 (BP Location: Right Arm, Patient Position: Sitting, Cuff Size: Large)   Pulse 89   Temp 97.8 ?F (36.6 ?C) (Oral)   Ht 5\' 10"  (1.778 m)   Wt 227 lb 3.2 oz (103.1 kg)   BMI 32.60 kg/m?  ?GENERAL: The patient is AO x3, in no acute distress. ?HEENT: Head is normocephalic and atraumatic. EOMI are intact. Mouth is well hydrated and without lesions. ?NECK: Supple. No masses ?LUNGS: Clear to auscultation. No presence of rhonchi/wheezing/rales. Adequate chest expansion ?HEART: RRR, normal s1 and s2. ?ABDOMEN: Soft, nontender, no guarding, no peritoneal signs, and nondistended. BS +. No masses. ?EXTREMITIES: Without any cyanosis, clubbing, rash, lesions or edema. ?NEUROLOGIC: AOx3, no focal motor deficit. ?SKIN: no jaundice, no rashes ? ? ?Imaging/Labs: ?as above ? ?I personally reviewed and interpreted the available labs,  imaging and endoscopic files. ? ?Impression and Plan: ?CHRISTOHPER DUBE is a 56 y.o. 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  Patient was incidentally found to have a nodular contour in his liver during his most recent hospitalization at [facility].  He has not presented any signs of portal hypertension and he does not have compelling evidence at the biochemical level for liver cirrhosis, although he has some mild hypoalbuminemia.  We will need to further evaluate this with elastography.  We will also recheck a CMP and INR. ? ?I have thorough discussion with the patient regarding the possible etiologies for his positive hepatitis C antibody.  I explained to him that chronic disease could lead to liver cirrhosis.  However, we will need to confirm his diagnosis by checking an HCVRNA.  If positive then we will need to complete the work-up prior to starting DAA.  I explained to him that if he had hepatitis C infection, this was possibly related to cocaine use, he should abstain from using any other drugs in the future.  He understood and agreed. ? ?- Check CMP, INR and HCV RNA ?- Schedule liver elastography ? ?All questions were answered.     ? ?[facility], MD ?Gastroenterology and Hepatology ?[facility] ? ?

## 2022-01-16 NOTE — Patient Instructions (Signed)
Perform blood workup Schedule liver elastography  

## 2022-01-17 DIAGNOSIS — K7460 Unspecified cirrhosis of liver: Secondary | ICD-10-CM | POA: Diagnosis not present

## 2022-01-17 DIAGNOSIS — R768 Other specified abnormal immunological findings in serum: Secondary | ICD-10-CM | POA: Diagnosis not present

## 2022-01-17 DIAGNOSIS — I509 Heart failure, unspecified: Secondary | ICD-10-CM | POA: Diagnosis not present

## 2022-01-17 DIAGNOSIS — Z6832 Body mass index (BMI) 32.0-32.9, adult: Secondary | ICD-10-CM | POA: Diagnosis not present

## 2022-01-22 DIAGNOSIS — E876 Hypokalemia: Secondary | ICD-10-CM | POA: Diagnosis not present

## 2022-01-22 DIAGNOSIS — K7460 Unspecified cirrhosis of liver: Secondary | ICD-10-CM | POA: Diagnosis not present

## 2022-01-22 DIAGNOSIS — I10 Essential (primary) hypertension: Secondary | ICD-10-CM | POA: Diagnosis not present

## 2022-01-22 DIAGNOSIS — I509 Heart failure, unspecified: Secondary | ICD-10-CM | POA: Diagnosis not present

## 2022-01-23 DIAGNOSIS — I5042 Chronic combined systolic (congestive) and diastolic (congestive) heart failure: Secondary | ICD-10-CM | POA: Diagnosis not present

## 2022-01-23 DIAGNOSIS — I10 Essential (primary) hypertension: Secondary | ICD-10-CM | POA: Diagnosis not present

## 2022-01-23 DIAGNOSIS — G4733 Obstructive sleep apnea (adult) (pediatric): Secondary | ICD-10-CM | POA: Diagnosis not present

## 2022-01-23 DIAGNOSIS — E6609 Other obesity due to excess calories: Secondary | ICD-10-CM | POA: Diagnosis not present

## 2022-01-24 DIAGNOSIS — I509 Heart failure, unspecified: Secondary | ICD-10-CM | POA: Diagnosis not present

## 2022-01-24 DIAGNOSIS — R931 Abnormal findings on diagnostic imaging of heart and coronary circulation: Secondary | ICD-10-CM | POA: Diagnosis not present

## 2022-01-24 DIAGNOSIS — I429 Cardiomyopathy, unspecified: Secondary | ICD-10-CM | POA: Diagnosis not present

## 2022-01-24 DIAGNOSIS — I517 Cardiomegaly: Secondary | ICD-10-CM | POA: Diagnosis not present

## 2022-01-28 ENCOUNTER — Ambulatory Visit (HOSPITAL_COMMUNITY)
Admission: RE | Admit: 2022-01-28 | Discharge: 2022-01-28 | Disposition: A | Discharge status: Home / Self Care | Payer: BC Managed Care – PPO | Source: Ambulatory Visit | Attending: Gastroenterology | Admitting: Gastroenterology

## 2022-01-28 DIAGNOSIS — R932 Abnormal findings on diagnostic imaging of liver and biliary tract: Secondary | ICD-10-CM

## 2022-01-28 DIAGNOSIS — K7689 Other specified diseases of liver: Secondary | ICD-10-CM | POA: Diagnosis not present

## 2022-01-28 DIAGNOSIS — R768 Other specified abnormal immunological findings in serum: Secondary | ICD-10-CM | POA: Diagnosis not present

## 2022-05-08 IMAGING — US US ABDOMEN LIMITED W/ ELASTOGRAPHY
1 series · 12 of 25 positions shown · non-contrast
Comparison: CT 10/05/2019

CLINICAL DATA: Hepatitis C

EXAM:
US ABDOMEN LIMITED - RIGHT UPPER QUADRANT
ULTRASOUND HEPATIC ELASTOGRAPHY
TECHNIQUE: Sonography of the right upper quadrant was performed. In addition,
ultrasound elastography evaluation of the liver was performed. A
region of interest was placed within the right lobe of the liver.
Following application of a compressive sonographic pulse, tissue
compressibility was assessed. Multiple assessments were performed at
the selected site. Median tissue compressibility was determined.
Previously, hepatic stiffness was assessed by shear wave velocity.
Based on recently published Society of Radiologists in Ultrasound
consensus article, reporting is now recommended to be performed in
the SI units of pressure (kiloPascals) representing hepatic
stiffness/elasticity. The obtained result is compared to the
published reference standards. (cACLD = compensated Advanced Chronic
Liver Disease)

[Series 1: us abdomen ruq w/elastography · 12 of 56 slices shown]
[im 3/56]
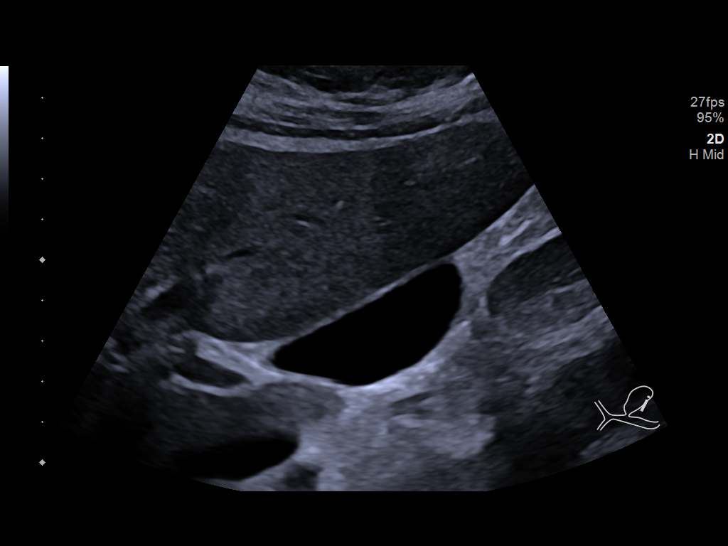
[im 7/56]
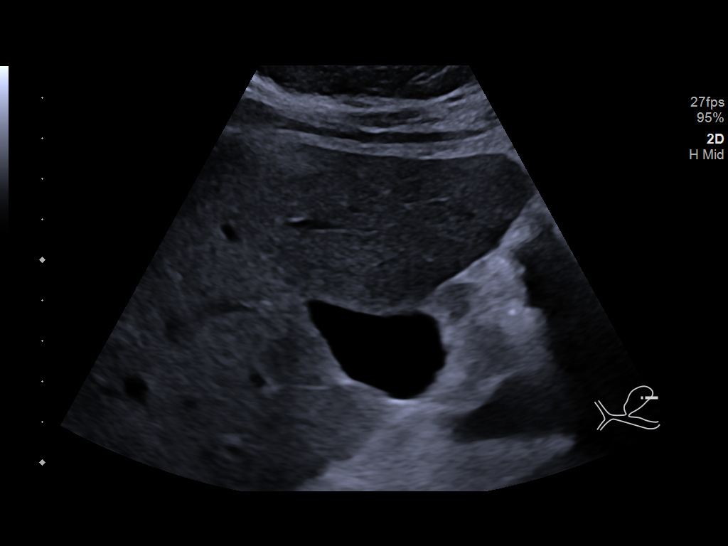
[im 12/56]
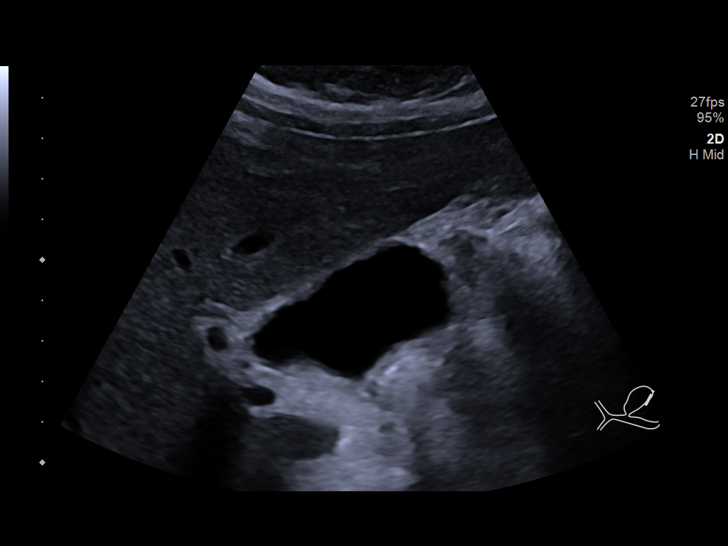
[im 17/56]
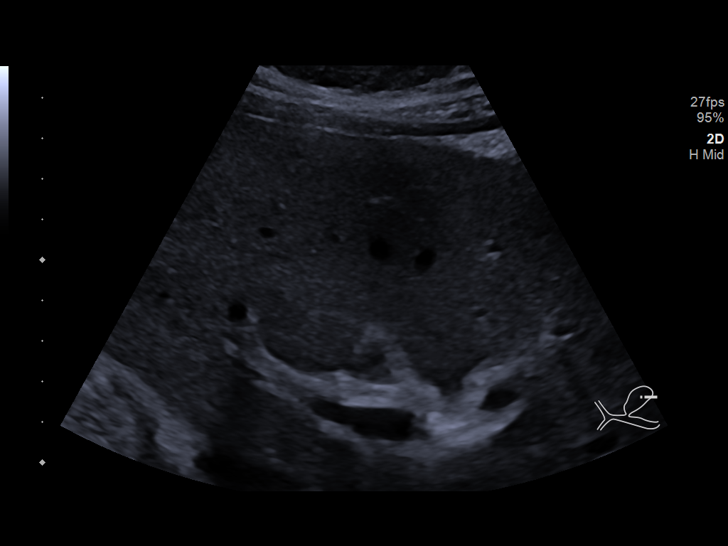
[im 21/56]
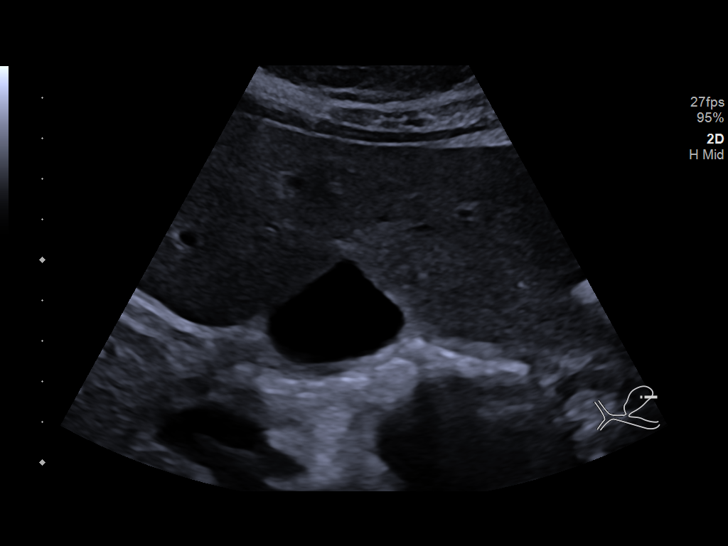
[im 26/56]
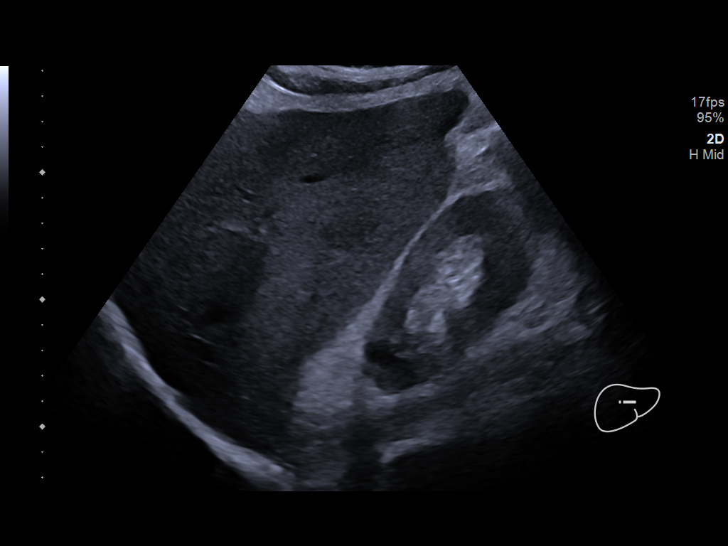
[im 30/56]
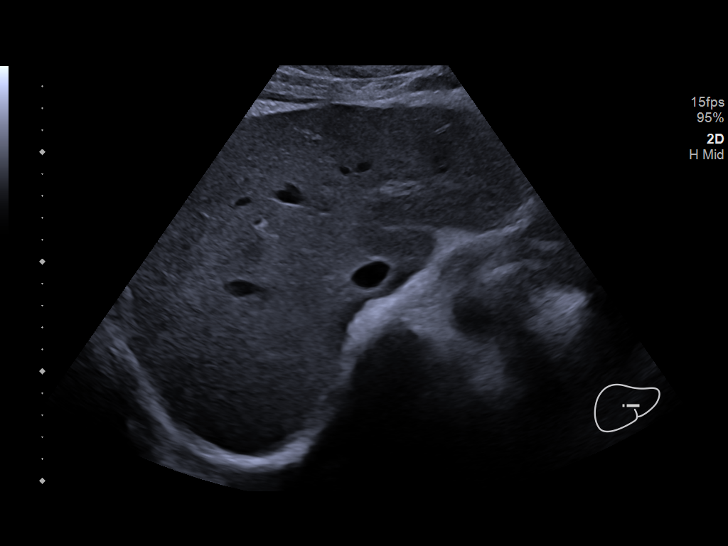
[im 35/56]
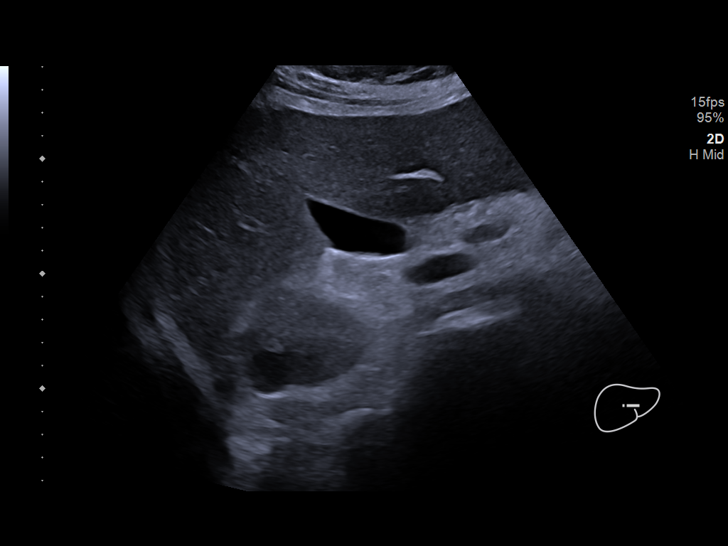
[im 39/56]
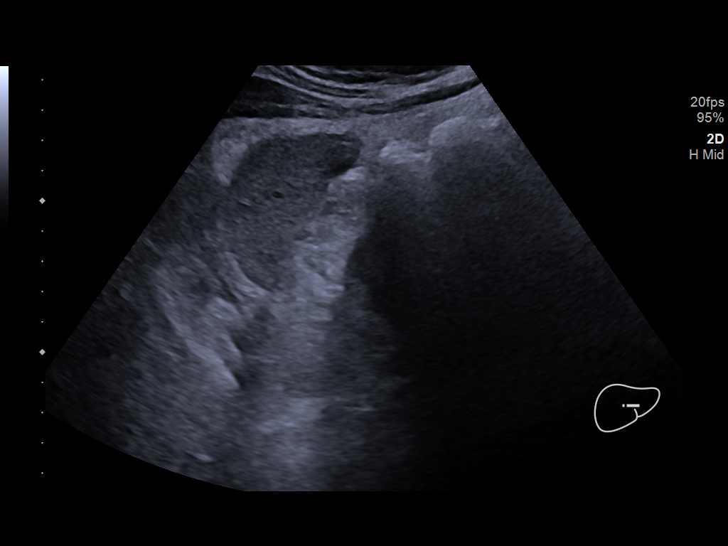
[im 44/56]
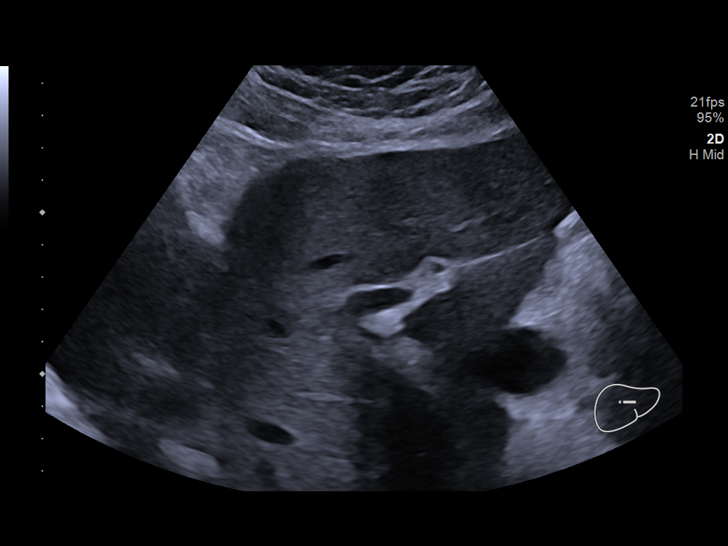
[im 49/56]
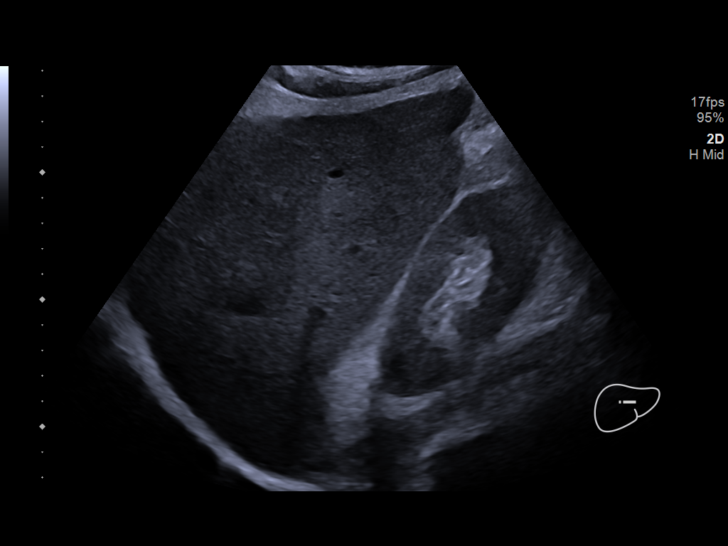
[im 53/56]
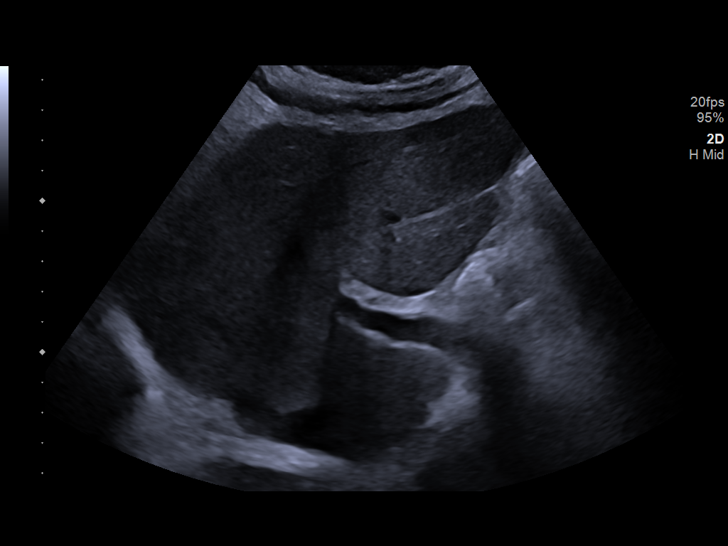

[12 of 25 positions shown; findings below may reference images not displayed]

FINDINGS: ULTRASOUND ABDOMEN LIMITED RIGHT UPPER QUADRANT

Gallbladder:

No gallstones or wall thickening visualized. No sonographic Murphy
sign noted.

Common bile duct:

Diameter: 2 mm

Liver:

No focal lesion identified. Diffusely increased parenchymal
echogenicity. Portal vein is patent on color Doppler imaging with
normal direction of blood flow towards the liver.

ULTRASOUND HEPATIC ELASTOGRAPHY

Device: Siemens Helix VTQ

Patient position: Oblique

Transducer 5C1

Number of measurements: 10

Hepatic segment:  8

Median kPa:

IQR:

IQR/Median kPa ratio:

Data quality:  Good

Diagnostic category: < or = 9 kPa: in the absence of other known
clinical signs, rules out cACLD

The use of hepatic elastography is applicable to patients with viral
hepatitis and non-alcoholic fatty liver disease. At this time, there
is insufficient data for the referenced cut-off values and use in
other causes of liver disease, including alcoholic liver disease.
Patients, however, may be assessed by elastography and serve as
their own reference standard/baseline.

In patients with non-alcoholic liver disease, the values suggesting
compensated advanced chronic liver disease (cACLD) may be lower, and
patients may need additional testing with elasticity results of [DATE]
kPa.

Please note that abnormal hepatic elasticity and shear wave
velocities may also be identified in clinical settings other than
with hepatic fibrosis, such as: acute hepatitis, elevated right
heart and central venous pressures including use of beta blockers,
Sixto disease (Klpigbb), infiltrative processes such as
mastocytosis/amyloidosis/infiltrative tumor/lymphoma, extrahepatic
cholestasis, with hyperemia in the post-prandial state, and with
liver transplantation. Correlation with patient history, laboratory
data, and clinical condition recommended.

Diagnostic Categories:

< or =5 kPa: high probability of being normal

< or =9 kPa: in the absence of other known clinical signs, rules [DATE] kPa and ?13 kPa: suggestive of cACLD, but needs further testing

>13 kPa: highly suggestive of cACLD

> or =17 kPa: highly suggestive of cACLD with an increased
probability of clinically significant portal hypertension
IMPRESSION: ULTRASOUND RUQ:

Diffusely increased parenchymal echogenicity is nonspecific but
commonly reflecting hepatic steatosis or chronic hepatocellular
disease. No overt hepatic lesion identified.

ULTRASOUND HEPATIC ELASTOGRAPHY:

Median kPa:

Diagnostic category: < or = 9 kPa: in the absence of other known
clinical signs, rules out cACLD

## 2022-06-27 ENCOUNTER — Other Ambulatory Visit: Payer: Self-pay | Admitting: *Deleted

## 2022-06-27 DIAGNOSIS — R1902 Left upper quadrant abdominal swelling, mass and lump: Secondary | ICD-10-CM

## 2022-07-02 ENCOUNTER — Encounter: Payer: Self-pay | Admitting: General Surgery

## 2022-07-02 ENCOUNTER — Ambulatory Visit: Payer: BC Managed Care – PPO | Admitting: General Surgery

## 2022-07-02 ENCOUNTER — Other Ambulatory Visit: Payer: Self-pay | Admitting: *Deleted

## 2022-07-02 VITALS — BP 180/110 | HR 60 | Temp 97.9°F | Resp 16 | Ht 70.0 in | Wt 231.0 lb

## 2022-07-02 DIAGNOSIS — R1902 Left upper quadrant abdominal swelling, mass and lump: Secondary | ICD-10-CM

## 2022-07-02 DIAGNOSIS — R932 Abnormal findings on diagnostic imaging of liver and biliary tract: Secondary | ICD-10-CM

## 2022-07-03 NOTE — Progress Notes (Signed)
David Morse; [id number]; [birth year]   HPI Patient is a 56 year old black male who was referred to my care by Dr. Gerilyn Pilgrim of [facility] for evaluation and treatment of a left upper quadrant abdominal wall mass.  He states that has been present for approximately 3 years.  He has been seen by multiple various primary care physicians and I was unable to find any specific work-up for that.  A CT scan of the abdomen done in 2020 did not comment on any abdominal wall masses.  A small umbilical hernia was noted at that time.  He states that seems to have increased in size and is causing him discomfort.  No drainage has been noted.  His past medical history is significant for congestive heart failure with an ejection fraction at 20 to 25%.  He is followed by cardiology for his heart failure and hypertension.Marland Kitchen  He also has been recently diagnosed with hepatitis C. Past Medical History:  Diagnosis Date   Hypertension    Pneumonia     Past Surgical History:  Procedure Laterality Date   ANTERIOR CERVICAL DECOMP/DISCECTOMY FUSION  11/17/2011   Procedure: ANTERIOR CERVICAL DECOMPRESSION/DISCECTOMY FUSION 1 LEVEL;  Surgeon: Cristi Loron, MD;  Location: [facility];  Service: Neurosurgery;  Laterality: N/A;  Cervical three-four anterior cervical decompression/discectomy fusion with plating   ANTERIOR CERVICAL DECOMP/DISCECTOMY FUSION  11/20/2011   Procedure: ANTERIOR CERVICAL DECOMPRESSION/DISCECTOMY FUSION 1 LEVEL;  Surgeon: Stefani Dama, MD;  Location: [facility];  Service: Neurosurgery;  Laterality: N/A;  evacuation of cervical hematoma   CIRCUMCISION     [facility] [place]    Family History  Problem Relation Age of Onset   Lupus Mother     Current Outpatient Medications on File Prior to Visit  Medication Sig Dispense Refill   carvedilol (COREG) 6.25 MG tablet Take by mouth.     empagliflozin (JARDIANCE) 10 MG TABS tablet 1 tablet Orally Once a day     furosemide (LASIX) 40 MG  tablet 1 tablet Orally Once a day     HYDROcodone-acetaminophen (NORCO) 7.5-325 MG tablet Take 1 tablet by mouth 2 (two) times daily as needed.     sacubitril-valsartan (ENTRESTO) 49-51 MG 1 tablet Orally Twice a day     tiZANidine (ZANAFLEX) 2 MG tablet TAKE 1 OR 2 TABLETS BY MOUTH AT BEDTIME AS NEEDED     zolpidem (AMBIEN CR) 12.5 MG CR tablet Take 12.5 mg by mouth at bedtime as needed.     No current facility-administered medications on file prior to visit.    Allergies  Allergen Reactions   Morphine And Related Nausea And Vomiting    Social History   Substance and Sexual Activity  Alcohol Use No    Social History   Tobacco Use  Smoking Status Former   Packs/day: 1.00   Types: Cigarettes   Quit date: 2013   Years since quitting: 10.7  Smokeless Tobacco Not on file    Review of Systems  Constitutional: Negative.   HENT:  Positive for sinus pain.   Eyes: Negative.   Respiratory: Negative.    Cardiovascular: Negative.   Gastrointestinal:  Positive for abdominal pain and heartburn.  Skin: Negative.   Neurological: Negative.   Endo/Heme/Allergies: Negative.   Psychiatric/Behavioral: Negative.      Objective   Vitals:   07/02/22 1450  BP: (!) 180/110  Pulse: 60  Resp: 16  Temp: 97.9 F (36.6 C)  SpO2: 95%    Physical Exam Vitals reviewed.  Constitutional:      Appearance: Normal appearance. He is obese. He is not ill-appearing.  HENT:     Head: Normocephalic and atraumatic.  Cardiovascular:     Rate and Rhythm: Normal rate and regular rhythm.     Heart sounds: Normal heart sounds. No murmur heard.    No friction rub. No gallop.  Pulmonary:     Effort: Pulmonary effort is normal. No respiratory distress.     Breath sounds: Normal breath sounds. No stridor. No wheezing, rhonchi or rales.  Abdominal:     General: There is no distension.     Palpations: Abdomen is soft. There is mass.     Tenderness: There is abdominal tenderness. There is no guarding  or rebound.     Comments: A 6 cm rubbery subcutaneous mass is noted in the left upper quadrant of the abdominal wall.  It does seem to emanate from the rectus muscle.  I could not feel a distinct abdominal wall defect.  It was tender to touch.  It was not reducible.  Skin:    General: Skin is warm and dry.  Neurological:     Mental Status: He is alert and oriented to person, place, and time.     Assessment  Left upper quadrant abdominal wall mass.  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  This would be a unique area for an abdominal wall hernia as he has not had surgical intervention in that area.  I told him that even if it was a lipoma, he has multiple comorbidities including his cardiopulmonary status would contraindicate any anesthetic for excision.  He was anxious about hearing this.  I did tell him that I will get an ultrasound of this area to have fully assess the abdominal wall mass.  Further management is pending those results. Plan

## 2022-07-08 ENCOUNTER — Ambulatory Visit (HOSPITAL_COMMUNITY)
Admission: RE | Admit: 2022-07-08 | Discharge: 2022-07-08 | Disposition: A | Discharge status: Home / Self Care | Payer: BC Managed Care – PPO | Source: Ambulatory Visit | Attending: General Surgery | Admitting: General Surgery

## 2022-07-08 DIAGNOSIS — R1902 Left upper quadrant abdominal swelling, mass and lump: Secondary | ICD-10-CM | POA: Insufficient documentation

## 2022-07-10 ENCOUNTER — Telehealth (INDEPENDENT_AMBULATORY_CARE_PROVIDER_SITE_OTHER): Payer: BC Managed Care – PPO | Admitting: General Surgery

## 2022-07-10 DIAGNOSIS — Z09 Encounter for follow-up examination after completed treatment for conditions other than malignant neoplasm: Secondary | ICD-10-CM

## 2022-07-10 NOTE — Telephone Encounter (Signed)
I called the patient and told him that the ultrasound showed a benign lipoma.  I did tell him that he was too high risk to undergo excision at [facility].  Should he desire a second opinion, he should get a referral through his primary care physician.  He understands this.

## 2023-09-07 ENCOUNTER — Encounter: Payer: Self-pay | Admitting: Gastroenterology

## 2023-09-07 ENCOUNTER — Ambulatory Visit: Payer: BC Managed Care – PPO | Admitting: Gastroenterology

## 2023-09-07 VITALS — BP 145/91 | HR 65 | Temp 98.5°F | Ht 70.0 in | Wt 232.4 lb

## 2023-09-07 DIAGNOSIS — K529 Noninfective gastroenteritis and colitis, unspecified: Secondary | ICD-10-CM

## 2023-09-07 DIAGNOSIS — R768 Other specified abnormal immunological findings in serum: Secondary | ICD-10-CM

## 2023-09-07 NOTE — Patient Instructions (Addendum)
Please have blood work completed at [facility].  We will call you with results once they have been received. Please allow 3-5 business days for review. 2 locations for [facility] in [place]:              1. [address], [place]              2. [address] [place], [place]   After you drop off stool sample to the lab you can start taking Imodium 2 mg scheduled once a day in the mornings.  Please hold if no bowel movement in 24 hours.  If you continue to have significant diarrhea after this please let me know.  I will be in touch with results and recommendations after I receive all of your lab and stool studies.  If you continue to have this issue and we do not find calls or no treatment is working we will consider an early colonoscopy.  We will follow-up in 6 weeks, sooner if needed.  It was a pleasure to see you today. I want to create trusting relationships with patients. If you receive a survey regarding your visit,  I greatly appreciate you taking time to fill this out on paper or through your MyChart. I value your feedback.  Brooke Bonito, MSN, FNP-BC, AGACNP-BC [facility]

## 2023-09-07 NOTE — Progress Notes (Addendum)
GI Office Note    Referring Provider: Shelby Dubin, FNP Primary Care Physician:  Shelby Dubin, FNP Primary Gastroenterologist: Dolores Frame, MD  Date:  09/07/2023  ID:  David Morse, DOB 1966/05/02, MRN [id number]   Chief Complaint   Chief Complaint  Patient presents with   New Patient (Initial Visit)    Patient being seen for diarrhea x 1 year. BM's are about 4 times or more   History of Present Illness  David Morse is a 57 y.o. male with a history of hepatitis C ab positive, OSA, combined systolic and diastolic heart failure with a EF 20-25% presenting today with complaint of diarrhea.  Last office visit/13/2023.  Noted at admission in [facility] for shortness of breath and chest tightness as well as peripheral edema and was diagnosed with acute systolic and diastolic heart failure.  During his evaluation he was noted to have possible nodular contour of his liver on right upper quadrant ultrasound and also had positive hepatitis C antibody testing which is why he was referred to GI.  He did not have any elevated LFTs at the time.  He had reported significant abdominal bloating prior to his ED visit but since being on Lasix, carvedilol, Entresto he had had significant improvement.  He denied any jaundice, diarrhea, pruritus, abdominal pain, or abdominal distention at his visit.  He had reported a colonoscopy around age 89 and was told he should repeat in 10 years.  He had quit smoking in 2013 and denied any alcohol use.  He did report history of inhaled cocaine in 1996.  He was advised to schedule RUQ ultrasound with elastography, assess for HCVRNA, CMP, and INR.  Ultrasound with elastography 01/28/22: -Diffusely increased echogenicity -Normal patent Doppler -kPa 5.7 -Concern for NASH without cirrhosis.  Awaiting blood work in regards to hepatitis C.  HCVRNA testing not completed.  Per PCP visit 08/24/2023 patient reported chronic diarrhea for the past 3  months when leaving the house.  Usually has diarrhea when leaving the house despite using hydroxyzine for anxiety.  Today:  Having diarrhea frequently for about a year altogether and has gotten worse recently. He reports having a fatty mass cut out in August. Has to go to the bathroom urgently in the mornings and may have to stop on the road while going to work. Sometimes may have to strain. When going to work he may take imodium AD and that does seem to help. Does not really have any lower abdominal pain. No melena or brbpr. He is able to eat but when he eats his stomach automatically needs to go to the bathroom. He has been working on changing up his diet and eating more chicken and fish, cut back on red meat. Dairy immediately makes him go to the bathroom. Able to eat cheese. Tries to avoid dairy.  Denies any family history of IBD or celiac.   Typically has 3-4 BM daily. No nocturnal stools. Used to have frequent urination.   Wt Readings from Last 3 Encounters:  09/07/23 232 lb 6.4 oz (105.4 kg)  07/02/22 231 lb (104.8 kg)  01/16/22 227 lb 3.2 oz (103.1 kg)    Current Outpatient Medications  Medication Sig Dispense Refill   empagliflozin (JARDIANCE) 10 MG TABS tablet 1 tablet Orally Once a day     furosemide (LASIX) 40 MG tablet 1 tablet Orally Once a day     HYDROcodone-acetaminophen (NORCO) 7.5-325 MG tablet Take 1 tablet by mouth 2 (two) times  daily as needed.     sacubitril-valsartan (ENTRESTO) 49-51 MG 1 tablet Orally Twice a day     tiZANidine (ZANAFLEX) 2 MG tablet TAKE 1 OR 2 TABLETS BY MOUTH AT BEDTIME AS NEEDED     zolpidem (AMBIEN CR) 12.5 MG CR tablet Take 12.5 mg by mouth at bedtime as needed.     carvedilol (COREG) 6.25 MG tablet Take by mouth.     No current facility-administered medications for this visit.    Past Medical History:  Diagnosis Date   Hypertension    Pneumonia     Past Surgical History:  Procedure Laterality Date   ANTERIOR CERVICAL  DECOMP/DISCECTOMY FUSION  11/17/2011   Procedure: ANTERIOR CERVICAL DECOMPRESSION/DISCECTOMY FUSION 1 LEVEL;  Surgeon: Cristi Loron, MD;  Location: [facility];  Service: Neurosurgery;  Laterality: N/A;  Cervical three-four anterior cervical decompression/discectomy fusion with plating   ANTERIOR CERVICAL DECOMP/DISCECTOMY FUSION  11/20/2011   Procedure: ANTERIOR CERVICAL DECOMPRESSION/DISCECTOMY FUSION 1 LEVEL;  Surgeon: Stefani Dama, MD;  Location: [facility];  Service: Neurosurgery;  Laterality: N/A;  evacuation of cervical hematoma   C[facility]CUMCISION     [facility] in [place]    Family History  Problem Relation Age of Onset   Lupus Mother     Allergies as of 09/07/2023 - Review Complete 09/07/2023  Allergen Reaction Noted   Morphine and codeine Nausea And Vomiting 11/11/2011    Social History   Socioeconomic History   Marital status: Single    Spouse name: Not on file   Number of children: Not on file   Years of education: Not on file   Highest education level: Not on file  Occupational History   Not on file  Tobacco Use   Smoking status: Former    Current packs/day: 0.00    Types: Cigarettes    Quit date: 2013    Years since quitting: 11.9   Smokeless tobacco: Not on file  Vaping Use   Vaping status: Never Used  Substance and Sexual Activity   Alcohol use: No   Drug use: Not Currently    Types: Marijuana, "Crack" cocaine, Cocaine   Sexual activity: Not on file  Other Topics Concern   Not on file  Social History Narrative   Not on file   Social Determinants of Health   Financial Resource Strain: Not on file  Food Insecurity: Not on file  Transportation Needs: Not on file  Physical Activity: Not on file  Stress: Not on file  Social Connections: Not on file     Review of Systems   Gen: Denies fever, chills, anorexia. Denies fatigue, weakness, weight loss.  CV: Denies chest pain, palpitations, syncope, peripheral edema, and claudication. Resp: Denies  dyspnea at rest, cough, wheezing, coughing up blood, and pleurisy. GI: See HPI Derm: Denies rash, itching, dry skin Psych: Denies depression, anxiety, memory loss, confusion. No homicidal or suicidal ideation.  Heme: Denies bruising, bleeding, and enlarged lymph nodes.  Physical Exam   BP (!) 145/91   Pulse 65   Temp 98.5 F (36.9 C)   Ht 5\' 10"  (1.778 m)   Wt 232 lb 6.4 oz (105.4 kg)   BMI 33.35 kg/m   General:   Alert and oriented. No distress noted. Pleasant and cooperative.  Head:  Normocephalic and atraumatic. Eyes:  Conjuctiva clear without scleral icterus. Mouth:  Oral mucosa pink and moist. Good dentition. No lesions. Lungs:  Clear to auscultation bilaterally. No wheezes, rales, or rhonchi. No distress.  Heart:  S1, S2 present without murmurs appreciated.  Abdomen:  +BS, soft, non-tender and rounded. No rebound or guarding. No HSM or masses noted. Rectal: deferred Msk:  Symmetrical without gross deformities. Normal posture. Extremities:  Without edema. Neurologic:  Alert and  oriented x4 Psych:  Alert and cooperative. Normal mood and affect.  Assessment  HUDSEN SECHLER is a 57 y.o. male with a history of hepatitis C ab postiive, OSA, combined systolic and diastolic heart failure with a EF 20-25% presenting today with complaint of diarrhea.   Diarrhea: Has noted chronic diarrhea over the last year he has been experiencing multiple episodes of diarrhea per day, averaging 3-4.  Generally goes once immediately in the morning with some significant urgency and occasionally has a postprandial component.  Denies any family history of celiac or IBD.  No melena or BRBPR.  Has been trying to avoid some red meat on his own.  Will assess for EPI, alpha gal allergy, celiac, and assess thyroid function as well as intestinal inflammation.  If labs unrevealing and no improvement with scheduled Imodium or if he has worsening of symptoms then we will consider early interval colonoscopy.   Discussed dietary changes as well.  Unable to rule out gallbladder dysfunction as cause of symptoms as well.  Could consider trial of cholestyramine in the future.  Hep C Ab positive: History positive hepatitis C antibody.  At last office visit last year he was recommended to have HCV RNA blood work performed to assess for active infection.  Does not appear that this was performed therefore we will repeat today with rest of his blood work.  PLAN   TSH, fecal calprotectin, pancreatic elastase, TSH, alpha gal, celiac panel, HCV RNA Diarrhea diet, avoid lactose Imodium 2 mg scheduled once daily Colonoscopy if all testing negative.  Follow up in 6 weeks    Brooke Bonito, MSN, FNP-BC, AGACNP-BC [facility]  I have reviewed the note and agree with the APP's assessment as described in this progress note  [name], MD Gastroenterology and Hepatology [facility]

## 2023-09-14 LAB — ALPHA-GAL PANEL
Allergen Lamb IgE: <0.1 kU/L
Beef IgE: <0.1 kU/L
IgE (Immunoglobulin E), Serum: 29 [IU]/mL (ref 6–495)
O215-IgE Alpha-Gal: <0.1 kU/L
Pork IgE: <0.1 kU/L

## 2023-09-14 LAB — GLIA (IGA/G) + TTG IGA
Antigliadin Abs, IgA: 5 U (ref 0–19)
Gliadin IgG: 5 U (ref 0–19)
Transglutaminase IgA: <2 U/mL (ref 0–3)

## 2023-09-14 LAB — HCV RNA QUANT: Hepatitis C Quantitation: NOT DETECTED [IU]/mL

## 2023-09-14 LAB — PANCREATIC ELASTASE, FECAL: Pancreatic Elastase, Fecal: 513 ug Elast./g (ref >200)

## 2023-09-14 LAB — CALPROTECTIN, FECAL: Calprotectin, Fecal: 19 ug/g (ref 0–120)

## 2023-09-14 LAB — TSH+FREE T4
Free T4: 1.24 ng/dL (ref 0.82–1.77)
TSH: 2.93 u[IU]/mL (ref 0.450–4.500)

## 2023-10-19 ENCOUNTER — Ambulatory Visit: Payer: BC Managed Care – PPO | Admitting: Gastroenterology

## 2023-10-19 NOTE — Progress Notes (Addendum)
 GI Office Note    Referring Provider: Alston Silvio BROCKS, FNP Primary Care Physician:  Alston Silvio BROCKS, FNP Primary Gastroenterologist: Toribio Rubins, MD  Date:  10/20/2023  ID:  David Morse, DOB 1966/03/25, MRN [id number]   Chief Complaint   Chief Complaint  Patient presents with   Follow-up    Follow up. Still having problems with stomach    History of Present Illness  David Morse is a 58 y.o. male with a history of hepatitis C ab positive, OSA, combined systolic and diastolic heart failure with a EF 20-25%  presenting today for follow up of diarrhea.   OV 01/16/22 - Noted an admission in [facility] for shortness of breath and chest tightness as well as peripheral edema and was diagnosed with acute systolic and diastolic heart failure.  During his evaluation he was noted to have possible nodular contour of his liver on right upper quadrant ultrasound and also had positive hepatitis C antibody testing which is why he was referred to GI.  He did not have any elevated LFTs at the time.  He had reported significant abdominal bloating prior to his ED visit but since being on Lasix, carvedilol, Entresto he had had significant improvement.  He denied any jaundice, diarrhea, pruritus, abdominal pain, or abdominal distention at his visit.  He had reported a colonoscopy around age 16 and was told he should repeat in 10 years.  He had quit smoking in 2013 and denied any alcohol use.  He did report history of inhaled cocaine in 1996.  He was advised to schedule RUQ ultrasound with elastography, assess for HCVRNA, CMP, and INR.   Ultrasound with elastography 01/28/22: -Diffusely increased echogenicity -Normal patent Doppler -kPa 5.7 -Concern for NASH without cirrhosis.  Awaiting blood work in regards to hepatitis C.  US  abdomen 07/08/22: -4.6 x 2 x 4. 6cm mass identified, likely lipoma  Per PCP visit 08/24/2023 patient reported chronic diarrhea for the past 3 months when leaving  the house.  Usually has diarrhea when leaving the house despite using hydroxyzine for anxiety.   Last office visit 09/07/23. Reported a year of frequent diarrhea becoming progressively worse. Has urgency in the mornings and having to stop along his route. Imodium does help. Denied lower abd pain. Recently reduced red meat consumption. Usually 3-4 BM daily. Labs and stool studies ordered. Advised scheduled 2mg  imodium daily. Avoid lactose. Advised colonoscopy if testing negative.   Labs 09/07/23: Normal pancreatic elastase, fecal calprotectin, TSH. Negative HCV, alpha gal and celiac panel.  (Patient reported some improvement in diarrhea and requested to discuss colonoscopy at follow up).   Today:  Sometime has some let sided belly pain.  Can have the pain before the urgency comes and feels like it potentially gest better after Bms. Sometimes takes imodium he will have 1-2 a day. If he does not take this he will have about 3-4. Sometimes the consistency depends on what he eats. If he eats greens those go straight through him. Still avoiding red meat currently- eating turkey and chicken. Trying to avoid processed foods as well. When he eats candy he does notice that he goes more often. He feels intermittently bloated.  Denies straining.  Denies any reflux symptoms, nausea, vomiting, or dysphagia.  Last colonoscopy was about 5-6 years ago in [place]. Denies history of polyps.   Wt Readings from Last 3 Encounters:  10/20/23 235 lb 6.4 oz (106.8 kg)  09/07/23 232 lb 6.4 oz (105.4 kg)  07/02/22 231  lb (104.8 kg)    Current Outpatient Medications  Medication Sig Dispense Refill   carvedilol (COREG) 6.25 MG tablet Take by mouth.     DULoxetine (CYMBALTA) 30 MG capsule Take 30 mg by mouth daily.     empagliflozin (JARDIANCE) 10 MG TABS tablet 1 tablet Orally Once a day     furosemide (LASIX) 40 MG tablet 1 tablet Orally Once a day     HYDROcodone -acetaminophen  (NORCO) 7.5-325 MG tablet Take 1 tablet by  mouth 2 (two) times daily as needed.     levocetirizine (XYZAL) 5 MG tablet SMARTSIG:1 Tablet(s) By Mouth Every Evening     sacubitril-valsartan (ENTRESTO) 49-51 MG 1 tablet Orally Twice a day     tiZANidine (ZANAFLEX) 2 MG tablet TAKE 1 OR 2 TABLETS BY MOUTH AT BEDTIME AS NEEDED     Vitamin D, Ergocalciferol, (DRISDOL) 1.25 MG (50000 UNIT) CAPS capsule Take 50,000 Units by mouth once a week.     zolpidem  (AMBIEN  CR) 12.5 MG CR tablet Take 12.5 mg by mouth at bedtime as needed.     hydrOXYzine (ATARAX) 25 MG tablet Take 25 mg by mouth every 6 (six) hours as needed.     rosuvastatin (CRESTOR) 20 MG tablet Take 20 mg by mouth daily.     spironolactone (ALDACTONE) 25 MG tablet Take 25 mg by mouth daily.     tamsulosin (FLOMAX) 0.4 MG CAPS capsule Take 0.4 mg by mouth daily.     No current facility-administered medications for this visit.    Past Medical History:  Diagnosis Date   Hypertension    Pneumonia     Past Surgical History:  Procedure Laterality Date   ANTERIOR CERVICAL DECOMP/DISCECTOMY FUSION  11/17/2011   Procedure: ANTERIOR CERVICAL DECOMPRESSION/DISCECTOMY FUSION 1 LEVEL;  Surgeon: Reyes JONETTA Budge, MD;  Location: [facility];  Service: Neurosurgery;  Laterality: N/A;  Cervical three-four anterior cervical decompression/discectomy fusion with plating   ANTERIOR CERVICAL DECOMP/DISCECTOMY FUSION  11/20/2011   Procedure: ANTERIOR CERVICAL DECOMPRESSION/DISCECTOMY FUSION 1 LEVEL;  Surgeon: Victory JINNY Gens, MD;  Location: [facility];  Service: Neurosurgery;  Laterality: N/A;  evacuation of cervical hematoma   C[facility]CUMCISION     [facility] in [place]    Family History  Problem Relation Age of Onset   Lupus Mother     Allergies as of 10/20/2023 - Review Complete 10/20/2023  Allergen Reaction Noted   Morphine  and codeine Nausea And Vomiting 11/11/2011    Social History   Socioeconomic History   Marital status: Single    Spouse name: Not on file   Number of children: Not on  file   Years of education: Not on file   Highest education level: Not on file  Occupational History   Not on file  Tobacco Use   Smoking status: Former    Current packs/day: 0.00    Types: Cigarettes    Quit date: 2013    Years since quitting: 12.0   Smokeless tobacco: Not on file  Vaping Use   Vaping status: Never Used  Substance and Sexual Activity   Alcohol use: No   Drug use: Not Currently    Types: Marijuana, Crack cocaine, Cocaine   Sexual activity: Not on file  Other Topics Concern   Not on file  Social History Narrative   Not on file   Social Drivers of Health   Financial Resource Strain: Not on file  Food Insecurity: Not on file  Transportation Needs: Not on file  Physical Activity: Not  on file  Stress: Not on file  Social Connections: Not on file     Review of Systems   Gen: Denies fever, chills, anorexia. Denies fatigue, weakness, weight loss.  CV: Denies chest pain, palpitations, syncope, peripheral edema, and claudication. Resp: Denies dyspnea at rest, cough, wheezing, coughing up blood, and pleurisy. GI: See HPI Derm: Denies rash, itching, dry skin Psych: Denies depression, anxiety, memory loss, confusion. No homicidal or suicidal ideation.  Heme: Denies bruising, bleeding, and enlarged lymph nodes.  Physical Exam   BP 138/85 (BP Location: Right Arm, Patient Position: Sitting, Cuff Size: Large)   Pulse 62   Temp 97.9 F (36.6 C) (Temporal)   Ht 5' 10 (1.778 m)   Wt 235 lb 6.4 oz (106.8 kg)   BMI 33.78 kg/m   General:   Alert and oriented. No distress noted. Pleasant and cooperative.  Head:  Normocephalic and atraumatic. Eyes:  Conjuctiva clear without scleral icterus. Mouth:  Oral mucosa pink and moist. Good dentition. No lesions. Lungs:  Clear to auscultation bilaterally. No wheezes, rales, or rhonchi. No distress.  Heart:  S1, S2 present without murmurs appreciated.  Abdomen:  +BS, soft, non-distended. No rebound or guarding. No HSM or  masses noted. Rectal: deferred Msk:  Symmetrical without gross deformities. Normal posture. Extremities:  Without edema. Neurologic:  Alert and  oriented x4 Psych:  Alert and cooperative. Normal mood and affect.  Assessment  David Morse is a 58 y.o. male with a history of hepatitis C ab positive, OSA, combined systolic and diastolic heart failure with a EF 20-25%  presenting today for follow up of diarrhea.    Diarrhea: Continues to experience urgency as well as some intermittent LLQ pain and diarrhea.  Has mushy but not watery stools.  Sometimes pain/discomfort does improve after bowel movements.  Has up to 3-4/day.  Continues to avoid red meat as well as spicy foods and has been working to avoid processed foods but continues to have symptoms.  Suspect more IBS however could be possible bile acid component however gallbladder does remain in situ.  We discussed multiple treatment options including cholestyramine  and Xifaxan.  He could potentially also be a good candidate for Viberzi.  Will plan for trial of cholestyramine  2 g once daily and if this does not seem effective we will increase dose.  He was educated on potential side effects of cholestyramine  as well as necessary administration instructions/timing of medication.  If no improvement with this then we will consider trial of Xifaxan.  He would like to avoid repeat colonoscopy for now.  We did discuss that if no improvement with trial of medication that we should consider colonoscopy to rule out microscopic colitis and he is in agreement.  He will continue to use Imodium for severe refractory diarrhea.  He would prefer more long-term management given Imodium use as needed is difficult given his job.  Positive hepatitis C antibody, hepatic steatosis: Previously with positive hepatitis C antibody.  This potential patient has had previous active hepatitis infection or at least exposure.  Recent HCV RNA levels not detected.  RUQ US  with  elastography in April 2023 with hepatic steatosis.  Monitor LFTs annually.  Advised to follow a Mediterranean diet given fatty liver.  PLAN   Cholestyramine  4g once daily. Likely need to take around noon.  Monitor for constipation.  Consider trial of Xifaxin if chlestryiamine has no benefit.  Imodium for severe refractory diarrhea. Colonoscopy if no improvement with cholestyramine  or xifaxin.  Advise  he needs at least annual monitoring of LFTs. Education on Mediterranean diet provided. Follow up in 3 months.     Charmaine Melia, MSN, FNP-BC, AGACNP-BC [facility]  I have reviewed the note and agree with the APP's assessment as described in this progress note  Toribio Fortune, MD Gastroenterology and Hepatology [facility]

## 2023-10-20 ENCOUNTER — Encounter: Payer: Self-pay | Admitting: Gastroenterology

## 2023-10-20 ENCOUNTER — Ambulatory Visit: Payer: BC Managed Care – PPO | Admitting: Gastroenterology

## 2023-10-20 VITALS — BP 138/85 | HR 62 | Temp 97.9°F | Ht 70.0 in | Wt 235.4 lb

## 2023-10-20 DIAGNOSIS — R197 Diarrhea, unspecified: Secondary | ICD-10-CM

## 2023-10-20 DIAGNOSIS — K760 Fatty (change of) liver, not elsewhere classified: Secondary | ICD-10-CM | POA: Diagnosis not present

## 2023-10-20 DIAGNOSIS — R1032 Left lower quadrant pain: Secondary | ICD-10-CM

## 2023-10-20 DIAGNOSIS — R768 Other specified abnormal immunological findings in serum: Secondary | ICD-10-CM

## 2023-10-20 DIAGNOSIS — K529 Noninfective gastroenteritis and colitis, unspecified: Secondary | ICD-10-CM

## 2023-10-20 MED ORDER — CHOLESTYRAMINE 4 GM/DOSE PO POWD: 2.0000 g | Freq: Every day | ORAL | 0 refills | Status: AC

## 2023-10-20 NOTE — Patient Instructions (Addendum)
 Please start cholestyramine  2g once daily in 8 oz water.  Given the timing of your other medications you will need to take this around noon.  Cholestyramine  should be taken 1 hour after morning medications and no other medication should be taken within 4 to 6 hours of taking this medication.  You may use Imodium for severe refractory diarrhea otherwise please hold this.  Constipation is a side effect of cholestyramine , please let me know if you experience constipation while on this dose.  If this does seem helpful but still having some looser consistency stools please let me know as we can increase your dose.  You will need at least annual monitoring of your liver enzymes going forward.  Follow-up in 3 months.  It was a pleasure to see you today. I want to create trusting relationships with patients. If you receive a survey regarding your visit,  I greatly appreciate you taking time to fill this out on paper or through your MyChart. I value your feedback.  Charmaine Melia, MSN, FNP-BC, AGACNP-BC [facility]

## 2024-01-06 ENCOUNTER — Encounter: Payer: Self-pay | Admitting: Gastroenterology
